# Patient Record
Sex: Male | Born: 1945 | Race: White | Hispanic: No | Marital: Married | Smoking: Former smoker
Health system: Southern US, Community
[De-identification: ages and names within clinical notes are randomized; demographics above are authoritative.]

## PROBLEM LIST (undated history)

## (undated) DIAGNOSIS — K219 Gastro-esophageal reflux disease without esophagitis: Secondary | ICD-10-CM

## (undated) DIAGNOSIS — G4730 Sleep apnea, unspecified: Secondary | ICD-10-CM

## (undated) DIAGNOSIS — H9190 Unspecified hearing loss, unspecified ear: Secondary | ICD-10-CM

## (undated) DIAGNOSIS — D649 Anemia, unspecified: Secondary | ICD-10-CM

## (undated) DIAGNOSIS — F419 Anxiety disorder, unspecified: Secondary | ICD-10-CM

## (undated) DIAGNOSIS — E7800 Pure hypercholesterolemia, unspecified: Secondary | ICD-10-CM

## (undated) DIAGNOSIS — E119 Type 2 diabetes mellitus without complications: Secondary | ICD-10-CM

## (undated) DIAGNOSIS — K7460 Unspecified cirrhosis of liver: Secondary | ICD-10-CM

## (undated) DIAGNOSIS — I10 Essential (primary) hypertension: Secondary | ICD-10-CM

## (undated) DIAGNOSIS — D696 Thrombocytopenia, unspecified: Secondary | ICD-10-CM

## (undated) DIAGNOSIS — M1990 Unspecified osteoarthritis, unspecified site: Secondary | ICD-10-CM

## (undated) HISTORY — PX: HERNIA REPAIR: SHX51

## (undated) HISTORY — DX: Unspecified cirrhosis of liver: K74.60

## (undated) HISTORY — PX: KNEE SURGERY: SHX244

## (undated) HISTORY — DX: Pure hypercholesterolemia, unspecified: E78.00

## (undated) HISTORY — DX: Type 2 diabetes mellitus without complications: E11.9

## (undated) HISTORY — DX: Thrombocytopenia, unspecified: D69.6

## (undated) HISTORY — DX: Anemia, unspecified: D64.9

---

## 2016-05-01 ENCOUNTER — Encounter (INDEPENDENT_AMBULATORY_CARE_PROVIDER_SITE_OTHER): Payer: Self-pay | Admitting: *Deleted

## 2016-05-23 ENCOUNTER — Ambulatory Visit (INDEPENDENT_AMBULATORY_CARE_PROVIDER_SITE_OTHER): Payer: Medicare Other | Admitting: Internal Medicine

## 2016-05-23 ENCOUNTER — Encounter (INDEPENDENT_AMBULATORY_CARE_PROVIDER_SITE_OTHER): Payer: Self-pay | Admitting: Internal Medicine

## 2016-05-23 ENCOUNTER — Encounter (INDEPENDENT_AMBULATORY_CARE_PROVIDER_SITE_OTHER): Payer: Self-pay

## 2016-05-23 VITALS — BP 112/68 | HR 62 | Temp 97.7°F | Resp 18 | Ht 69.0 in | Wt 238.8 lb

## 2016-05-23 DIAGNOSIS — E7800 Pure hypercholesterolemia, unspecified: Secondary | ICD-10-CM | POA: Insufficient documentation

## 2016-05-23 DIAGNOSIS — R197 Diarrhea, unspecified: Secondary | ICD-10-CM

## 2016-05-23 DIAGNOSIS — E119 Type 2 diabetes mellitus without complications: Secondary | ICD-10-CM | POA: Insufficient documentation

## 2016-05-23 DIAGNOSIS — D509 Iron deficiency anemia, unspecified: Secondary | ICD-10-CM | POA: Diagnosis not present

## 2016-05-23 DIAGNOSIS — Z794 Long term (current) use of insulin: Secondary | ICD-10-CM

## 2016-05-23 DIAGNOSIS — D539 Nutritional anemia, unspecified: Secondary | ICD-10-CM | POA: Insufficient documentation

## 2016-05-23 LAB — CBC WITH DIFFERENTIAL/PLATELET
BASOS ABS: 42 {cells}/uL (ref 0–200)
Basophils Relative: 1 %
Eosinophils Absolute: 84 cells/uL (ref 15–500)
Eosinophils Relative: 2 %
HEMATOCRIT: 38.9 % (ref 38.5–50.0)
HEMOGLOBIN: 12.4 g/dL — AB (ref 13.2–17.1)
LYMPHS ABS: 1008 {cells}/uL (ref 850–3900)
Lymphocytes Relative: 24 %
MCH: 29.2 pg (ref 27.0–33.0)
MCHC: 31.9 g/dL — AB (ref 32.0–36.0)
MCV: 91.7 fL (ref 80.0–100.0)
MONO ABS: 294 {cells}/uL (ref 200–950)
MPV: 11.3 fL (ref 7.5–12.5)
Monocytes Relative: 7 %
NEUTROS PCT: 66 %
Neutro Abs: 2772 cells/uL (ref 1500–7800)
Platelets: 110 10*3/uL — ABNORMAL LOW (ref 140–400)
RBC: 4.24 MIL/uL (ref 4.20–5.80)
RDW: 14.7 % (ref 11.0–15.0)
WBC: 4.2 10*3/uL (ref 3.8–10.8)

## 2016-05-23 LAB — FERRITIN: Ferritin: 26 ng/mL (ref 20–380)

## 2016-05-23 MED ORDER — DICYCLOMINE HCL 10 MG PO CAPS: 10.0000 mg | ORAL_CAPSULE | Freq: Three times a day (TID) | ORAL | Status: DC

## 2016-05-23 NOTE — Patient Instructions (Signed)
Labs today. Dicyclomine 67m tid

## 2016-05-23 NOTE — Progress Notes (Addendum)
   Subjective:    Patient ID: Larry Hayes, male    DOB: [birth year], 70 y.o.   MRN: [id number]  HPI Referrred by Dr. Ginette Otto for anemia/ diarrhea. Hx of anemia. He also has diarrhea. He has had diarrhea for 2-3 years. He says the diarrhea has progressively worsened and sometimes he is incontinent. Sometimes he will have 7-8 stools a day. His stools are never formed.  He has tried Imodium which did not help.  He has been evaluated by Dr. Davina Poke for anemia. Stool was negative for blood. Colonoscopy and EGD were normal.  Appetite is good. No weight. Usually average about 5 stools a day. No melena or BRRB.    Hx of cirrhosis for NAFLD.    04/17/2016 H and H 12.3 and 389.2, platelet ct 100. Iron 45, Transferrin 320, TIBC 448, UIBC 403, Ferritin 9.20, % saturation 10.   His last colonoscopy was in 2016 per records by Dr. Farris Has in [place]. (anemia)  Normal per patient. EGD showed gastritis.   Review of Systems Past Medical History  Diagnosis Date  . Diabetes ([place])   . High cholesterol     Past Surgical History  Procedure Laterality Date  . Knee surgery      rt arthroscopy  . Hernia repair      bilateral inguinal age 3    No Known Allergies  No current outpatient prescriptions on file prior to visit.   No current facility-administered medications on file prior to visit.   Current Outpatient Prescriptions  Medication Sig Dispense Refill  . atorvastatin (LIPITOR) 80 MG tablet Take 80 mg by mouth at bedtime.    . ferrous sulfate 325 (65 FE) MG tablet Take 325 mg by mouth 2 (two) times daily with a meal.    . insulin glargine (LANTUS) 100 UNIT/ML injection Inject 45 Units into the skin at bedtime.    . ISOSORBIDE PO Take 30 mg by mouth daily.    . Magnesium Oxide 400 (240 Mg) MG TABS Take 400 mg by mouth daily.    . metFORMIN (GLUCOPHAGE) 1000 MG tablet Take 1,000 mg by mouth 2 (two) times daily with a meal.    . METOPROLOL TARTRATE PO Take 25 mg by mouth 2 (two) times daily.     Marland Kitchen omeprazole (PRILOSEC) 40 MG capsule Take 40 mg by mouth every evening.    Marland Kitchen OVER THE COUNTER MEDICATION Alphabetic MVI - Patient states that he takes 1 by mouth every morning.    Marland Kitchen PARoxetine (PAXIL) 20 MG tablet Take 20 mg by mouth daily.     No current facility-administered medications for this visit.        Objective:   Physical Exam  Blood pressure 112/68, pulse 62, temperature 97.7 F (36.5 C), temperature source Oral, resp. rate 18, height 5' 9"  (1.753 m), weight 238 lb 12.8 oz (108.319 kg). Alert and oriented. Skin warm and dry. Oral mucosa is moist.   . Sclera anicteric, conjunctivae is pink. Thyroid not enlarged. No cervical lymphadenopathy. Lungs clear. Heart regular rate and rhythm.  Abdomen is soft. Bowel sounds are positive. No hepatomegaly. No abdominal masses felt. No tenderness.  No edema to lower extremities.   Stool brown and guaiac positive.  Lot [id number] Ex 9/17    Assessment & Plan:  Anemia. Guaiac positive stool. Recent EGD and Colonoscopy normal. Will get reports.  May need Given's capsule.  Iron studies today. Further recommendations to follow.

## 2016-05-24 LAB — IRON: IRON: 240 ug/dL — AB (ref 50–180)

## 2016-05-29 ENCOUNTER — Telehealth (INDEPENDENT_AMBULATORY_CARE_PROVIDER_SITE_OTHER): Payer: Self-pay | Admitting: Internal Medicine

## 2016-05-29 DIAGNOSIS — D509 Iron deficiency anemia, unspecified: Secondary | ICD-10-CM

## 2016-05-29 NOTE — Telephone Encounter (Signed)
Patient called to get his results and he wants to know if he needs a return appointment. I did call the patient to let him know Karna Christmas was not here this afternoon, but she'll be here tomorrow.  [phone]

## 2016-05-30 NOTE — Telephone Encounter (Signed)
Lab orde is noted for 4 weeks.

## 2016-05-31 ENCOUNTER — Encounter (INDEPENDENT_AMBULATORY_CARE_PROVIDER_SITE_OTHER): Payer: Self-pay | Admitting: *Deleted

## 2016-05-31 ENCOUNTER — Other Ambulatory Visit (INDEPENDENT_AMBULATORY_CARE_PROVIDER_SITE_OTHER): Payer: Self-pay | Admitting: *Deleted

## 2016-05-31 DIAGNOSIS — D509 Iron deficiency anemia, unspecified: Secondary | ICD-10-CM

## 2016-06-01 ENCOUNTER — Encounter (INDEPENDENT_AMBULATORY_CARE_PROVIDER_SITE_OTHER): Payer: Self-pay | Admitting: Internal Medicine

## 2016-06-01 NOTE — Progress Notes (Signed)
Patient was given an appointment for 07/04/16 at 2:45pm.  An appointment letter was mailed to the patient.

## 2016-06-04 ENCOUNTER — Encounter (INDEPENDENT_AMBULATORY_CARE_PROVIDER_SITE_OTHER): Payer: Self-pay

## 2016-06-21 ENCOUNTER — Telehealth (INDEPENDENT_AMBULATORY_CARE_PROVIDER_SITE_OTHER): Payer: Self-pay | Admitting: Internal Medicine

## 2016-06-21 NOTE — Telephone Encounter (Signed)
At [place] in July. hemocult and CBC. May need an EGD per Dr. Laural Golden. He had EGD/Colonoscopy in 2016

## 2016-06-27 LAB — CBC
HCT: 37.9 % — ABNORMAL LOW (ref 38.5–50.0)
Hemoglobin: 12.2 g/dL — ABNORMAL LOW (ref 13.2–17.1)
MCH: 30.3 pg (ref 27.0–33.0)
MCHC: 32.2 g/dL (ref 32.0–36.0)
MCV: 94 fL (ref 80.0–100.0)
MPV: 11.7 fL (ref 7.5–12.5)
PLATELETS: 103 10*3/uL — AB (ref 140–400)
RBC: 4.03 MIL/uL — AB (ref 4.20–5.80)
RDW: 14.6 % (ref 11.0–15.0)
WBC: 4.7 10*3/uL (ref 3.8–10.8)

## 2016-07-02 ENCOUNTER — Telehealth (INDEPENDENT_AMBULATORY_CARE_PROVIDER_SITE_OTHER): Payer: Self-pay | Admitting: Internal Medicine

## 2016-07-03 NOTE — Telephone Encounter (Signed)
error 

## 2016-07-04 ENCOUNTER — Ambulatory Visit (INDEPENDENT_AMBULATORY_CARE_PROVIDER_SITE_OTHER): Payer: Medicare Other | Admitting: Internal Medicine

## 2016-07-04 ENCOUNTER — Encounter (INDEPENDENT_AMBULATORY_CARE_PROVIDER_SITE_OTHER): Payer: Self-pay | Admitting: Internal Medicine

## 2016-07-04 VITALS — BP 130/50 | HR 80 | Ht 69.0 in | Wt 238.3 lb

## 2016-07-04 DIAGNOSIS — R197 Diarrhea, unspecified: Secondary | ICD-10-CM

## 2016-07-04 DIAGNOSIS — R152 Fecal urgency: Secondary | ICD-10-CM | POA: Diagnosis not present

## 2016-07-04 DIAGNOSIS — R195 Other fecal abnormalities: Secondary | ICD-10-CM

## 2016-07-04 NOTE — Patient Instructions (Addendum)
CBC today. OV in 3 months.  Increase Dicyclomine to 4 times a day.

## 2016-07-04 NOTE — Progress Notes (Signed)
Subjective:    Patient ID: Larry Hayes, male    DOB: [birth year], 70 y.o.   MRN: [id number]  HPIHere today for f/u. He was last seen last month for anemia/diarrhea. Hx of anemia. Has had diarrhea  for 2-3 years.  Diarrhea has progressively worsened.  Sometimes he will have 7-8 stools a day.  Stools were never formed. Imodium has not helped in the past.  Evaluated by Dr. Davina Poke for anemia. Stool negative for blood.   In 2016 colonoscopy and EGD in 2016 were normal (anemia). He was guaiac postive in office with me.  AT [facility] , hemoccult and CBC. May need an EGD. He tells me is doing okay. He says he doesn't have a lot of strength.  He continues to have about 4-5 stools a day. In the morning, his stool is formed. His stool is liquid after breakfast. If he has the urge to go to have a BM, he needs to go right then His appetite is good. No weight loss.     CBC Latest Ref Rng & Units 06/27/2016 05/23/2016  WBC 3.8 - 10.8 K/uL 4.7 4.2  Hemoglobin 13.2 - 17.1 g/dL 12.2(L) 12.4(L)  Hematocrit 38.5 - 50.0 % 37.9(L) 38.9  Platelets 140 - 400 K/uL 103(L) 110(L)       05/23/2016 Ferritin 26 Iron 240.   04/17/2016 H and H 12.3 and 389.2, platelet ct 100. Iron 45, Transferrin 320, TIBC 448, UIBC 403, Ferritin 9.20, % saturation 10.   His last colonoscopy was in 2016 per records by Dr. Farris Has in [place]. (anemia)  Normal per patient. EGD showed gastritis.      Review of Systems Past Medical History:  Diagnosis Date  . Anemia   . Diabetes ([place])   . High cholesterol     Past Surgical History:  Procedure Laterality Date  . HERNIA REPAIR     bilateral inguinal age 57  . KNEE SURGERY     rt arthroscopy    No Known Allergies  Current Outpatient Prescriptions on File Prior to Visit  Medication Sig Dispense Refill  . atorvastatin (LIPITOR) 80 MG tablet Take 80 mg by mouth at bedtime.    . dicyclomine (BENTYL) 10 MG capsule Take 1 capsule (10 mg total) by mouth 3 (three) times  daily before meals. 90 capsule 3  . ferrous sulfate 325 (65 FE) MG tablet Take 325 mg by mouth 2 (two) times daily with a meal.    . insulin glargine (LANTUS) 100 UNIT/ML injection Inject 45 Units into the skin at bedtime.    . ISOSORBIDE PO Take 30 mg by mouth daily.    . Magnesium Oxide 400 (240 Mg) MG TABS Take 400 mg by mouth daily.    . metFORMIN (GLUCOPHAGE) 1000 MG tablet Take 1,000 mg by mouth 2 (two) times daily with a meal.    . METOPROLOL TARTRATE PO Take 25 mg by mouth 2 (two) times daily.    Marland Kitchen omeprazole (PRILOSEC) 40 MG capsule Take 40 mg by mouth every evening.    Marland Kitchen OVER THE COUNTER MEDICATION Alphabetic MVI - Patient states that he takes 1 by mouth every morning.    Marland Kitchen PARoxetine (PAXIL) 20 MG tablet Take 20 mg by mouth daily.     No current facility-administered medications on file prior to visit.        Objective:   Physical Exam Blood pressure (!) 130/50, pulse 80, height 5' 9"  (1.753 m), weight 238 lb 4.8 oz (108.1 kg). Alert and  oriented. Skin warm and dry. Oral mucosa is moist.   . Sclera anicteric, conjunctivae is pink. Thyroid not enlarged. No cervical lymphadenopathy. Lungs clear. Heart regular rate and rhythm.  Abdomen is soft. Bowel sounds are positive. No hepatomegaly. No abdominal masses felt. No tenderness.  No edema to lower extremities.    Stool brown and guaiac negative.       Assessment & Plan:  Diarrhea/Urgency. CBC today.  OV in 3 months.  Three stool cards sent home with patient.  Will again discuss with Dr. Laural Golden.

## 2016-07-05 LAB — CBC
HEMATOCRIT: 36.9 % — AB (ref 38.5–50.0)
HEMOGLOBIN: 11.8 g/dL — AB (ref 13.2–17.1)
MCH: 30.3 pg (ref 27.0–33.0)
MCHC: 32 g/dL (ref 32.0–36.0)
MCV: 94.6 fL (ref 80.0–100.0)
MPV: 11.7 fL (ref 7.5–12.5)
Platelets: 99 10*3/uL — ABNORMAL LOW (ref 140–400)
RBC: 3.9 MIL/uL — AB (ref 4.20–5.80)
RDW: 14.4 % (ref 11.0–15.0)
WBC: 4.5 10*3/uL (ref 3.8–10.8)

## 2016-07-30 ENCOUNTER — Telehealth (INDEPENDENT_AMBULATORY_CARE_PROVIDER_SITE_OTHER): Payer: Self-pay | Admitting: Internal Medicine

## 2016-07-30 ENCOUNTER — Encounter (INDEPENDENT_AMBULATORY_CARE_PROVIDER_SITE_OTHER): Payer: Self-pay | Admitting: Internal Medicine

## 2016-07-30 ENCOUNTER — Telehealth (INDEPENDENT_AMBULATORY_CARE_PROVIDER_SITE_OTHER): Payer: Self-pay | Admitting: *Deleted

## 2016-07-30 ENCOUNTER — Other Ambulatory Visit (INDEPENDENT_AMBULATORY_CARE_PROVIDER_SITE_OTHER): Payer: Self-pay | Admitting: Internal Medicine

## 2016-07-30 DIAGNOSIS — R195 Other fecal abnormalities: Secondary | ICD-10-CM

## 2016-07-30 DIAGNOSIS — D696 Thrombocytopenia, unspecified: Secondary | ICD-10-CM

## 2016-07-30 DIAGNOSIS — D500 Iron deficiency anemia secondary to blood loss (chronic): Secondary | ICD-10-CM

## 2016-07-30 NOTE — Telephone Encounter (Signed)
   Diagnosis:    Result(s)   Card 1: Positive    Card 2: Positive:   Card 3:  Negative:    Completed by: Delrae Renderri Setzer,NP   HEMOCCULT SENSA DEVELOPER: LOT#:[id number]   EXPIRATION DATE: 9/18   HEMOCCULT SENSA CARD:  LOT#:[id number]   EXPIRATION DATE: 07-18   CARD CONTROL RESULTS:  POSITIVE:Positive NEGATIVE: Negative    ADDITIONAL COMMENTS: Camelia Engerri has talked with Dr.Rehman and the patient is going to have a EGD. Camelia Engerri has made the patient aware. Dewayne Hatchnn will contact the patient.

## 2016-07-30 NOTE — Telephone Encounter (Signed)
Larry Hayes, US abdomen. I have talked with patient.

## 2016-07-30 NOTE — Telephone Encounter (Signed)
Patient is aware 

## 2016-07-30 NOTE — Telephone Encounter (Signed)
US abdomen ordered

## 2016-07-30 NOTE — Telephone Encounter (Signed)
Ann, EGD. I have spoken with patient.

## 2016-07-31 DIAGNOSIS — R195 Other fecal abnormalities: Secondary | ICD-10-CM | POA: Insufficient documentation

## 2016-07-31 DIAGNOSIS — D500 Iron deficiency anemia secondary to blood loss (chronic): Secondary | ICD-10-CM | POA: Insufficient documentation

## 2016-07-31 NOTE — Telephone Encounter (Signed)
US sch'd 08/02/16, patient aware

## 2016-07-31 NOTE — Telephone Encounter (Signed)
EGD sch'd 08/01/16, patient aware 

## 2016-07-31 NOTE — Telephone Encounter (Signed)
EGD sch'd 08/01/16, patient aware

## 2016-08-01 ENCOUNTER — Ambulatory Visit (HOSPITAL_COMMUNITY)
Admission: RE | Admit: 2016-08-01 | Discharge: 2016-08-01 | Disposition: A | Discharge status: Home / Self Care | Payer: Medicare Other | Source: Ambulatory Visit | Attending: Internal Medicine | Admitting: Internal Medicine

## 2016-08-01 ENCOUNTER — Encounter (HOSPITAL_COMMUNITY): Payer: Self-pay | Admitting: *Deleted

## 2016-08-01 ENCOUNTER — Encounter (HOSPITAL_COMMUNITY)
Admission: RE | Disposition: A | Discharge status: Home / Self Care | Payer: Self-pay | Source: Ambulatory Visit | Attending: Internal Medicine

## 2016-08-01 DIAGNOSIS — E119 Type 2 diabetes mellitus without complications: Secondary | ICD-10-CM | POA: Diagnosis not present

## 2016-08-01 DIAGNOSIS — K921 Melena: Secondary | ICD-10-CM | POA: Insufficient documentation

## 2016-08-01 DIAGNOSIS — K3189 Other diseases of stomach and duodenum: Secondary | ICD-10-CM | POA: Diagnosis not present

## 2016-08-01 DIAGNOSIS — Z8719 Personal history of other diseases of the digestive system: Secondary | ICD-10-CM | POA: Diagnosis not present

## 2016-08-01 DIAGNOSIS — Z794 Long term (current) use of insulin: Secondary | ICD-10-CM | POA: Diagnosis not present

## 2016-08-01 DIAGNOSIS — D509 Iron deficiency anemia, unspecified: Secondary | ICD-10-CM | POA: Diagnosis present

## 2016-08-01 DIAGNOSIS — Z87891 Personal history of nicotine dependence: Secondary | ICD-10-CM | POA: Diagnosis not present

## 2016-08-01 DIAGNOSIS — K766 Portal hypertension: Secondary | ICD-10-CM | POA: Diagnosis not present

## 2016-08-01 DIAGNOSIS — K2970 Gastritis, unspecified, without bleeding: Secondary | ICD-10-CM | POA: Diagnosis not present

## 2016-08-01 DIAGNOSIS — R195 Other fecal abnormalities: Secondary | ICD-10-CM

## 2016-08-01 DIAGNOSIS — Z79899 Other long term (current) drug therapy: Secondary | ICD-10-CM | POA: Insufficient documentation

## 2016-08-01 DIAGNOSIS — D500 Iron deficiency anemia secondary to blood loss (chronic): Secondary | ICD-10-CM | POA: Diagnosis not present

## 2016-08-01 HISTORY — PX: ESOPHAGOGASTRODUODENOSCOPY: SHX5428

## 2016-08-01 LAB — GLUCOSE, CAPILLARY: GLUCOSE-CAPILLARY: 102 mg/dL — AB (ref 65–99)

## 2016-08-01 SURGERY — EGD (ESOPHAGOGASTRODUODENOSCOPY)
Anesthesia: Moderate Sedation

## 2016-08-01 MED ORDER — STERILE WATER FOR IRRIGATION IR SOLN
Status: DC | PRN
  Administered 2016-08-01: 100 mL

## 2016-08-01 MED ORDER — BUTAMBEN-TETRACAINE-BENZOCAINE 2-2-14 % EX AERO
INHALATION_SPRAY | CUTANEOUS | Status: DC | PRN
  Administered 2016-08-01: 2 via TOPICAL

## 2016-08-01 MED ORDER — MIDAZOLAM HCL 5 MG/5ML IJ SOLN
INTRAMUSCULAR | Status: DC | PRN
  Administered 2016-08-01: 2 mg via INTRAVENOUS
  Administered 2016-08-01 (×2): 1 mg via INTRAVENOUS
  Administered 2016-08-01: 2 mg via INTRAVENOUS

## 2016-08-01 MED ORDER — MEPERIDINE HCL 50 MG/ML IJ SOLN
INTRAMUSCULAR | Status: AC
  Filled 2016-08-01: qty 1

## 2016-08-01 MED ORDER — MEPERIDINE HCL 50 MG/ML IJ SOLN
INTRAMUSCULAR | Status: DC | PRN
  Administered 2016-08-01 (×2): 25 mg via INTRAVENOUS

## 2016-08-01 MED ORDER — MIDAZOLAM HCL 5 MG/5ML IJ SOLN
INTRAMUSCULAR | Status: AC
  Filled 2016-08-01: qty 10

## 2016-08-01 MED ORDER — SODIUM CHLORIDE 0.9 % IV SOLN
INTRAVENOUS | Status: DC
  Administered 2016-08-01: 14:00:00 via INTRAVENOUS

## 2016-08-01 NOTE — Discharge Instructions (Addendum)
Gastrointestinal Endoscopy, Care After Refer to this sheet in the next few weeks. These instructions provide you with information on caring for yourself after your procedure. Your caregiver may also give you more specific instructions. Your treatment has been planned according to current medical practices, but problems sometimes occur. Call your caregiver if you have any problems or questions after your procedure. HOME CARE INSTRUCTIONS  If you were given medicine to help you relax (sedative), do not drive, operate machinery, or sign important documents for 24 hours.  Avoid alcohol and hot or warm beverages for the first 24 hours after the procedure.  Only take over-the-counter or prescription medicines for pain, discomfort, or fever as directed by your caregiver. You may resume taking your normal medicines unless your caregiver tells you otherwise. Ask your caregiver when you may resume taking medicines that may cause bleeding, such as aspirin, clopidogrel, or warfarin.  You may return to your normal diet and activities on the day after your procedure, or as directed by your caregiver. Walking may help to reduce any bloated feeling in your abdomen.  Drink enough fluids to keep your urine clear or pale yellow.  You may gargle with salt water if you have a sore throat. SEEK IMMEDIATE MEDICAL CARE IF:  You have severe nausea or vomiting.  You have severe abdominal pain, abdominal cramps that last longer than 6 hours, or abdominal swelling (distention).  You have severe shoulder or back pain.  You have trouble swallowing.  You have shortness of breath, your breathing is shallow, or you are breathing faster than normal.  You have a fever or a rapid heartbeat.  You vomit blood or material that looks like coffee grounds.  You have bloody, black, or tarry stools. MAKE SURE YOU:  Understand these instructions.  Will watch your condition.  Will get help right away if you are not doing  well or get worse.   This information is not intended to replace advice given to you by your health care provider. Make sure you discuss any questions you have with your health care provider.   Resume usual medications and diet. No driving for 24 hours. Physician will call with results of biopsy and ultrasound(to be done tomorrow). Will check hemoglobin in one month. Office will call.

## 2016-08-01 NOTE — Op Note (Signed)
[facility] Patient Name: Larry Hayes Procedure Date: 08/01/2016 2:24 PM MRN: [id number] Date of Birth: 1946-08-10 Attending MD: Hildred Laser , MD CSN: [id number] Age: 70 Admit Type: Outpatient Procedure:                Upper GI endoscopy Indications:              Iron deficiency anemia secondary to chronic blood                            loss, Melena. Patient gives history of duodenal                            ulcer about 10 years ago. Providers:                Hildred Laser, MD, Rosina Lowenstein, RN, Isabella Stalling, Technician Referring MD:             Olena Mater, MD Medicines:                Cetacaine spray, Meperidine 50 mg IV, Midazolam 6                            mg IV Complications:            No immediate complications. Estimated Blood Loss:     Estimated blood loss was minimal. Procedure:                Pre-Anesthesia Assessment:                           - Prior to the procedure, a History and Physical                            was performed, and patient medications and                            allergies were reviewed. The patient's tolerance of                            previous anesthesia was also reviewed. The risks                            and benefits of the procedure and the sedation                            options and risks were discussed with the patient.                            All questions were answered, and informed consent                            was obtained. Prior Anticoagulants: The patient has  taken no previous anticoagulant or antiplatelet                            agents. ASA Grade Assessment: III - A patient with                            severe systemic disease. After reviewing the risks                            and benefits, the patient was deemed in                            satisfactory condition to undergo the procedure.                           After obtaining  informed consent, the endoscope was                            passed under direct vision. Throughout the                            procedure, the patient's blood pressure, pulse, and                            oxygen saturations were monitored continuously. The                            [id number] ([id number]) scope was introduced through the                            mouth, and advanced to the second part of duodenum.                            The upper GI endoscopy was accomplished without                            difficulty. The patient tolerated the procedure                            well. Scope In: 2:51:59 PM Scope Out: 2:59:50 PM Total Procedure Duration: 0 hours 7 minutes 51 seconds  Findings:      The examined esophagus was normal.      The Z-line was regular and was found 41 cm from the incisors.      Moderate portal hypertensive gastropathy was found in the gastric fundus       and in the gastric body with stigmata of bleed.      Nodular mucosa was found in the gastric antrum and in the prepyloric       region of the stomach. Biopsies were taken with a cold forceps for       histology.      The duodenal bulb and second portion of the duodenum were normal. Impression:               - Normal esophagus.                           -  Z-line regular, 41 cm from the incisors.                           - Portal hypertensive gastropathy with stigmata of                            bleed                           - Nodular mucosa in the gastric antrum and in the                            prepyloric region of the stomach. Biopsied.                           - Normal duodenal bulb and second portion of the                            duodenum.                           Comment; Suspect patient has been losing blood                            secondary to portal gastropathy.                           He does not have esophageal or gastric varices.                           Patient will  return for upper abdominal ultrasound                            on 08/02/2016. Moderate Sedation:      Moderate (conscious) sedation was administered by the endoscopy nurse       and supervised by the endoscopist. The following parameters were       monitored: oxygen saturation, heart rate, blood pressure, CO2       capnography and response to care. Total physician intraservice time was       13 minutes. Recommendation:           - Patient has a contact number available for                            emergencies. The signs and symptoms of potential                            delayed complications were discussed with the                            patient. Return to normal activities tomorrow.                            Written discharge instructions were provided to the  patient.                           - Resume previous diet today.                           - Continue present medications.                           - Await pathology results. Procedure Code(s):        --- Professional ---                           [id number], Esophagogastroduodenoscopy, flexible,                            transoral; with biopsy, single or multiple                           99152, Moderate sedation services provided by the                            same physician or other qualified health care                            professional performing the diagnostic or                            therapeutic service that the sedation supports,                            requiring the presence of an independent trained                            observer to assist in the monitoring of the                            patient's level of consciousness and physiological                            status; initial 15 minutes of intraservice time,                            patient age 70 years or older Diagnosis Code(s):        --- Professional ---                           K76.6, Portal  hypertension                           K31.89, Other diseases of stomach and duodenum                           D50.0, Iron deficiency anemia secondary to blood  loss (chronic)                           K92.1, Melena (includes Hematochezia) CPT copyright 2016 American Medical Association. All rights reserved. The codes documented in this report are preliminary and upon coder review may  be revised to meet current compliance requirements. Hildred Laser, MD Hildred Laser, MD 08/01/2016 3:11:05 PM This report has been signed electronically. Number of Addenda: 0

## 2016-08-01 NOTE — H&P (Signed)
Larry Hayes is an 70 y.o. male.   Chief Complaint: Patient is here for EGD. HPI: Patient is 70 year old Caucasian male who is undergoing diagnostic EGD. He has history of iron deficiency anemia and heme positive stool. He was found to be anemic back in May 2017 when serum iron saturation and ferritin were all low. He underwent EGD in June 2016 revealing mild gastritis. Colonoscopy in September 2016 and was normal exam. Both of the studies were performed by Dr. Audie [facility] of [facility]. He has noted his stools. Black intermittently but he is not sure whether started before he went on iron. He has history of duodenal ulcer. He does not take OTC NSAIDs. He denies heartburn nausea vomiting abdominal pain or rectal bleeding.  Past Medical History:  Diagnosis Date  . Anemia   . Diabetes ([place])   . High cholesterol       Duodenal diagnosed over 10 years ago when he presented with pain.  Past Surgical History:  Procedure Laterality Date  . HERNIA REPAIR     bilateral inguinal age 2  . KNEE SURGERY     rt arthroscopy    History reviewed. No pertinent family history. Social History:  reports that he quit smoking about 15 years ago. He has never used smokeless tobacco. He reports that he does not drink alcohol or use drugs.  Allergies: No Known Allergies  Medications Prior to Admission  Medication Sig Dispense Refill  . atorvastatin (LIPITOR) 80 MG tablet Take 80 mg by mouth at bedtime.    . dicyclomine (BENTYL) 10 MG capsule Take 1 capsule (10 mg total) by mouth 3 (three) times daily before meals. 90 capsule 3  . ferrous sulfate 325 (65 FE) MG tablet Take 325 mg by mouth 2 (two) times daily with a meal.    . insulin glargine (LANTUS) 100 UNIT/ML injection Inject 45 Units into the skin at bedtime.    . ISOSORBIDE PO Take 30 mg by mouth daily.    . Magnesium Oxide 400 (240 Mg) MG TABS Take 400 mg by mouth daily.    . metFORMIN (GLUCOPHAGE) 1000 MG tablet Take 1,000 mg by mouth 2 (two)  times daily with a meal.    . METOPROLOL TARTRATE PO Take 25 mg by mouth 2 (two) times daily.    Marland Kitchen omeprazole (PRILOSEC) 40 MG capsule Take 40 mg by mouth every evening.    Marland Kitchen OVER THE COUNTER MEDICATION Alphabetic MVI - Patient states that he takes 1 by mouth every morning.    Marland Kitchen PARoxetine (PAXIL) 20 MG tablet Take 20 mg by mouth daily.      Results for orders placed or performed during the hospital encounter of 08/01/16 (from the past 48 hour(s))  Glucose, capillary     Status: Abnormal   Collection Time: 08/01/16  1:49 PM  Result Value Ref Range   Glucose-Capillary 102 (H) 65 - 99 mg/dL   No results found.  ROS  Blood pressure 129/65, pulse 62, temperature 97.8 F (36.6 C), temperature source Oral, resp. rate 17, height 5' 9"  (1.753 m), weight 236 lb (107 kg), SpO2 95 %. Physical Exam  Constitutional: He appears well-developed and well-nourished.  HENT:  Mouth/Throat: Oropharynx is clear and moist.  Eyes: Conjunctivae are normal. No scleral icterus.  Neck: No thyromegaly present.  Cardiovascular: Normal rate, regular rhythm and normal heart sounds.   No murmur heard. Respiratory: Effort normal and breath sounds normal.  GI:  Abdomen is protuberant but soft and nontender without organomegaly  or masses.  Musculoskeletal: He exhibits no edema.  Lymphadenopathy:    He has no cervical adenopathy.  Neurological: He is alert.  Skin: Skin is warm and dry.     Assessment/Plan Iron deficiency anemia and heme positive stool. History of peptic ulcer disease. Diagnostic EGD.  Hildred Laser, MD 08/01/2016, 2:40 PM

## 2016-08-02 ENCOUNTER — Ambulatory Visit (HOSPITAL_COMMUNITY)
Admission: RE | Admit: 2016-08-02 | Discharge: 2016-08-02 | Disposition: A | Discharge status: Home / Self Care | Payer: Medicare Other | Source: Ambulatory Visit | Attending: Internal Medicine | Admitting: Internal Medicine

## 2016-08-02 DIAGNOSIS — D696 Thrombocytopenia, unspecified: Secondary | ICD-10-CM | POA: Diagnosis not present

## 2016-08-02 DIAGNOSIS — K7460 Unspecified cirrhosis of liver: Secondary | ICD-10-CM | POA: Insufficient documentation

## 2016-08-02 DIAGNOSIS — R188 Other ascites: Secondary | ICD-10-CM | POA: Insufficient documentation

## 2016-08-02 DIAGNOSIS — R161 Splenomegaly, not elsewhere classified: Secondary | ICD-10-CM | POA: Insufficient documentation

## 2016-08-06 ENCOUNTER — Telehealth (INDEPENDENT_AMBULATORY_CARE_PROVIDER_SITE_OTHER): Payer: Self-pay | Admitting: Internal Medicine

## 2016-08-06 ENCOUNTER — Encounter (HOSPITAL_COMMUNITY): Payer: Self-pay | Admitting: Internal Medicine

## 2016-08-06 NOTE — Telephone Encounter (Signed)
Patient called, would like test results.  [phone]

## 2016-08-07 NOTE — Telephone Encounter (Signed)
Diagnosis Stomach, biopsy, nodular mucosa at antrum and pre pyloric FOVEOLAR HYPERPLASIA REACTIVE GASTROPATHY/CHEMICAL GASTRITIS NO HELICOBACTER PYLORI ORGANISMS ARE IDENTIFIED (WARTHIN-STARRY STAIN) NEGATIVE FOR DYSPLASIA OR MALIGNANCY Marlena ClipperZhaoli Lane MD Pathologist, Electronic Signature (Case signed 08/03/2016) Specimen Gross and Clinical Information Specimen(s) Obtained: Stomach, biopsy, nodular mucosa at antrum and pre pyloric Specimen Clinical  To be discussed with Dr.Rehman.

## 2016-08-07 NOTE — Telephone Encounter (Signed)
Results have been given

## 2016-08-07 NOTE — Telephone Encounter (Signed)
Patient called and stated that he'd like his results from last week.  He stated that he spoke to Terri this morning, but she stated that she said she didn't know anything.  He really would like to Dr. Laural Golden.  [phone]

## 2016-08-08 ENCOUNTER — Other Ambulatory Visit (INDEPENDENT_AMBULATORY_CARE_PROVIDER_SITE_OTHER): Payer: Self-pay | Admitting: *Deleted

## 2016-08-08 ENCOUNTER — Encounter (INDEPENDENT_AMBULATORY_CARE_PROVIDER_SITE_OTHER): Payer: Self-pay | Admitting: *Deleted

## 2016-08-08 DIAGNOSIS — D508 Other iron deficiency anemias: Secondary | ICD-10-CM

## 2016-08-08 DIAGNOSIS — D509 Iron deficiency anemia, unspecified: Secondary | ICD-10-CM

## 2016-08-08 DIAGNOSIS — R195 Other fecal abnormalities: Secondary | ICD-10-CM

## 2016-08-08 NOTE — Telephone Encounter (Signed)
Dr.Rehman was made aware of this and he stated that he was going to call the patient.

## 2016-08-08 NOTE — Progress Notes (Signed)
Patient already has an appointment scheduled for 10/04/16 at 1:45pm

## 2016-08-20 ENCOUNTER — Other Ambulatory Visit (INDEPENDENT_AMBULATORY_CARE_PROVIDER_SITE_OTHER): Payer: Self-pay | Admitting: *Deleted

## 2016-08-20 DIAGNOSIS — D500 Iron deficiency anemia secondary to blood loss (chronic): Secondary | ICD-10-CM

## 2016-08-21 ENCOUNTER — Ambulatory Visit (INDEPENDENT_AMBULATORY_CARE_PROVIDER_SITE_OTHER): Payer: Medicare Other | Admitting: Internal Medicine

## 2016-08-21 ENCOUNTER — Encounter (INDEPENDENT_AMBULATORY_CARE_PROVIDER_SITE_OTHER): Payer: Self-pay | Admitting: Internal Medicine

## 2016-08-21 ENCOUNTER — Other Ambulatory Visit (INDEPENDENT_AMBULATORY_CARE_PROVIDER_SITE_OTHER): Payer: Self-pay | Admitting: *Deleted

## 2016-08-21 VITALS — BP 120/60 | HR 64 | Temp 98.2°F | Ht 69.0 in | Wt 242.1 lb

## 2016-08-21 DIAGNOSIS — D696 Thrombocytopenia, unspecified: Secondary | ICD-10-CM | POA: Diagnosis not present

## 2016-08-21 DIAGNOSIS — R197 Diarrhea, unspecified: Secondary | ICD-10-CM | POA: Diagnosis not present

## 2016-08-21 DIAGNOSIS — K7469 Other cirrhosis of liver: Secondary | ICD-10-CM | POA: Diagnosis not present

## 2016-08-21 DIAGNOSIS — K7460 Unspecified cirrhosis of liver: Secondary | ICD-10-CM

## 2016-08-21 HISTORY — DX: Thrombocytopenia, unspecified: D69.6

## 2016-08-21 HISTORY — DX: Unspecified cirrhosis of liver: K74.60

## 2016-08-21 NOTE — Patient Instructions (Signed)
Fiber 4 gms po Labs today.

## 2016-08-21 NOTE — Progress Notes (Signed)
Subjective:    Patient ID: Larry Hayes, male    DOB: [birth year], 70 y.o.   MRN: [id number]  HPI  Here today for f/u. Underwent an EGD in August for IDA and guaiac positive stools He tells me he has diarrhea. He has diarrhea daily. He is taking Bentyl QID.  He says when the pain hits, he has to go to the BR.  His appetite is good. No weight loss. He says for the most part he feels good. Sometimes he feels weak.        Hx of cirrhosis for NAFLD (Patient states he was unaware of this).   04/17/2016 H and H 12.3 and 389.2, platelet ct 100. Iron 45, Transferrin 320, TIBC 448, UIBC 403, Ferritin 9.20, % saturation 10.     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  Impression:               - Normal esophagus.                           - Z-line regular, 41 cm from the incisors.                           - Portal hypertensive gastropathy with stigmata of                            bleed                           - Nodular mucosa in the gastric antrum and in the                            prepyloric region of the stomach. Biopsied.                           - Normal duodenal bulb and second portion of the                            duodenum.                            Comment; Suspect patient has been losing blood                            secondary to portal gastropathy.                           He does not have esophageal or gastric varices.                           Patient will return for upper abdominal ultrasound                            on 08/02/2016. 08/02/2016 US abdomen: increased liver function tests:  IMPRESSION: Cirrhotic appearance of the liver with trace amount of abdominal ascites and splenomegaly.  Diffuse thickening of the gallbladder wall without evidence of cholelithiasis, likely reactive. His last colonoscopy was in 2016 per records by Dr. Farris Has in  [place]. (anemia) Normal per patient. EGD showed gastritis.   Review  of Systems Past Medical History:  Diagnosis Date  . Anemia   . Diabetes ([facility] [facility])   . Hepatic cirrhosis ([place]) 08/21/2016  . High cholesterol     Past Surgical History:  Procedure Laterality Date  . ESOPHAGOGASTRODUODENOSCOPY N/A 08/01/2016   Procedure: ESOPHAGOGASTRODUODENOSCOPY (EGD);  Surgeon: Rogene Houston, MD;  [facility];  Service: Endoscopy;  Laterality: N/A;  2:45  . HERNIA REPAIR     bilateral inguinal age 32  . KNEE SURGERY     rt arthroscopy    No Known Allergies  Current Outpatient Prescriptions on File Prior to Visit  Medication Sig Dispense Refill  . atorvastatin (LIPITOR) 80 MG tablet Take 80 mg by mouth at bedtime.    . dicyclomine (BENTYL) 10 MG capsule Take 1 capsule (10 mg total) by mouth 3 (three) times daily before meals. 90 capsule 3  . ferrous sulfate 325 (65 FE) MG tablet Take 325 mg by mouth 2 (two) times daily with a meal.    . insulin glargine (LANTUS) 100 UNIT/ML injection Inject 45 Units into the skin at bedtime.    . ISOSORBIDE PO Take 30 mg by mouth daily.    . Magnesium Oxide 400 (240 Mg) MG TABS Take 400 mg by mouth daily.    . metFORMIN (GLUCOPHAGE) 1000 MG tablet Take 1,000 mg by mouth 2 (two) times daily with a meal.    . METOPROLOL TARTRATE PO Take 25 mg by mouth 2 (two) times daily.    Marland Kitchen omeprazole (PRILOSEC) 40 MG capsule Take 40 mg by mouth every evening.    Marland Kitchen OVER THE COUNTER MEDICATION Alphabetic MVI - Patient states that he takes 1 by mouth every morning.    Marland Kitchen PARoxetine (PAXIL) 20 MG tablet Take 20 mg by mouth daily.     No current facility-administered medications on file prior to visit.        Objective:   Physical Exam Blood pressure 120/60, pulse 64, temperature 98.2 F (36.8 C), height 5' 9"  (1.753 m), weight 242 lb 1.6 oz (109.8 kg). Alert and oriented. Skin warm and dry. Oral mucosa is moist.   . Sclera anicteric, conjunctivae is pink. Thyroid not enlarged. No cervical lymphadenopathy. Lungs clear. Heart regular rate  and rhythm.  Abdomen is soft. Bowel sounds are positive. No hepatomegaly. No abdominal masses felt. No tenderness.  No edema to lower extremities         Assessment & Plan:  Diarrhea. Imodium BID, Fiber 4 gms Cirrhosis: CBC, Hepatic function today. Acute hepatitis series, ceruloplasmin, SMA, ANA, PT/INR.  OV in 6 months.

## 2016-08-22 LAB — CBC WITH DIFFERENTIAL/PLATELET
BASOS PCT: 1 %
Basophils Absolute: 39 cells/uL (ref 0–200)
EOS PCT: 2 %
Eosinophils Absolute: 78 cells/uL (ref 15–500)
HCT: 38.2 % — ABNORMAL LOW (ref 38.5–50.0)
Hemoglobin: 12.4 g/dL — ABNORMAL LOW (ref 13.2–17.1)
LYMPHS PCT: 24 %
Lymphs Abs: 936 cells/uL (ref 850–3900)
MCH: 31.1 pg (ref 27.0–33.0)
MCHC: 32.5 g/dL (ref 32.0–36.0)
MCV: 95.7 fL (ref 80.0–100.0)
MONO ABS: 312 {cells}/uL (ref 200–950)
MONOS PCT: 8 %
MPV: 11.3 fL (ref 7.5–12.5)
NEUTROS PCT: 65 %
Neutro Abs: 2535 cells/uL (ref 1500–7800)
PLATELETS: 107 10*3/uL — AB (ref 140–400)
RBC: 3.99 MIL/uL — AB (ref 4.20–5.80)
RDW: 13.9 % (ref 11.0–15.0)
WBC: 3.9 10*3/uL (ref 3.8–10.8)

## 2016-08-22 LAB — ANA: ANA: NEGATIVE

## 2016-08-22 LAB — HEPATIC FUNCTION PANEL
ALT: 31 U/L (ref 9–46)
AST: 44 U/L — ABNORMAL HIGH (ref 10–35)
Albumin: 3.2 g/dL — ABNORMAL LOW (ref 3.6–5.1)
Alkaline Phosphatase: 159 U/L — ABNORMAL HIGH (ref 40–115)
BILIRUBIN DIRECT: 0.3 mg/dL — AB (ref <=0.2)
BILIRUBIN INDIRECT: 0.7 mg/dL (ref 0.2–1.2)
BILIRUBIN TOTAL: 1 mg/dL (ref 0.2–1.2)
Total Protein: 6 g/dL — ABNORMAL LOW (ref 6.1–8.1)

## 2016-08-22 LAB — HEPATITIS PANEL, ACUTE
HCV AB: NEGATIVE
HEP A IGM: NONREACTIVE
HEP B C IGM: NONREACTIVE
HEP B S AG: NEGATIVE

## 2016-08-22 LAB — PROTIME-INR
INR: 1.2 — ABNORMAL HIGH
Prothrombin Time: 12.6 s — ABNORMAL HIGH (ref 9.0–11.5)

## 2016-08-24 LAB — ANTI-SMOOTH MUSCLE ANTIBODY, IGG

## 2016-08-27 ENCOUNTER — Telehealth (INDEPENDENT_AMBULATORY_CARE_PROVIDER_SITE_OTHER): Payer: Self-pay | Admitting: Internal Medicine

## 2016-08-27 NOTE — Telephone Encounter (Signed)
Patient would like results.  [phone]

## 2016-08-28 NOTE — Telephone Encounter (Signed)
Results given to wife. Am waiting on 2 other labs

## 2016-09-06 LAB — ALPHA-1-ANTITRYPSIN: A-1 Antitrypsin, Ser: 99 mg/dL (ref 83–199)

## 2016-09-06 LAB — CERULOPLASMIN: CERULOPLASMIN: 28 mg/dL (ref 18–36)

## 2016-09-12 ENCOUNTER — Telehealth (INDEPENDENT_AMBULATORY_CARE_PROVIDER_SITE_OTHER): Payer: Self-pay | Admitting: Internal Medicine

## 2016-09-12 NOTE — Telephone Encounter (Signed)
Results of rest of lab given to wife.    Larry Hayes, CMET in 3 months

## 2016-09-12 NOTE — Telephone Encounter (Signed)
Patient called, stated that he missed your call because he was out of town.  He stated that you can call him back and if you miss him to leave the message with this wife.  [phone]

## 2016-09-13 ENCOUNTER — Other Ambulatory Visit (INDEPENDENT_AMBULATORY_CARE_PROVIDER_SITE_OTHER): Payer: Self-pay | Admitting: *Deleted

## 2016-09-13 DIAGNOSIS — K7469 Other cirrhosis of liver: Secondary | ICD-10-CM

## 2016-09-13 NOTE — Telephone Encounter (Signed)
C-Met order is completed for 3 months.

## 2016-09-24 ENCOUNTER — Other Ambulatory Visit (INDEPENDENT_AMBULATORY_CARE_PROVIDER_SITE_OTHER): Payer: Self-pay | Admitting: Internal Medicine

## 2016-09-24 DIAGNOSIS — D509 Iron deficiency anemia, unspecified: Secondary | ICD-10-CM

## 2016-09-24 DIAGNOSIS — R197 Diarrhea, unspecified: Secondary | ICD-10-CM

## 2016-10-04 ENCOUNTER — Ambulatory Visit (INDEPENDENT_AMBULATORY_CARE_PROVIDER_SITE_OTHER): Payer: Medicare Other | Admitting: Internal Medicine

## 2016-11-09 HISTORY — PX: CATARACT EXTRACTION: SUR2

## 2016-11-21 ENCOUNTER — Encounter (INDEPENDENT_AMBULATORY_CARE_PROVIDER_SITE_OTHER): Payer: Self-pay | Admitting: Internal Medicine

## 2016-11-21 ENCOUNTER — Ambulatory Visit (INDEPENDENT_AMBULATORY_CARE_PROVIDER_SITE_OTHER): Payer: Medicare Other | Admitting: Internal Medicine

## 2016-11-21 ENCOUNTER — Encounter (INDEPENDENT_AMBULATORY_CARE_PROVIDER_SITE_OTHER): Payer: Self-pay

## 2016-11-21 VITALS — BP 120/60 | HR 76 | Temp 98.4°F | Ht 69.0 in | Wt 254.9 lb

## 2016-11-21 DIAGNOSIS — K7469 Other cirrhosis of liver: Secondary | ICD-10-CM | POA: Diagnosis not present

## 2016-11-21 DIAGNOSIS — K588 Other irritable bowel syndrome: Secondary | ICD-10-CM | POA: Diagnosis not present

## 2016-11-21 DIAGNOSIS — D508 Other iron deficiency anemias: Secondary | ICD-10-CM | POA: Diagnosis not present

## 2016-11-21 LAB — CBC WITH DIFFERENTIAL/PLATELET
BASOS PCT: 0 %
Basophils Absolute: 0 cells/uL (ref 0–200)
Eosinophils Absolute: 84 cells/uL (ref 15–500)
Eosinophils Relative: 2 %
HEMATOCRIT: 39 % (ref 38.5–50.0)
Hemoglobin: 12.7 g/dL — ABNORMAL LOW (ref 13.2–17.1)
LYMPHS PCT: 22 %
Lymphs Abs: 924 cells/uL (ref 850–3900)
MCH: 31.1 pg (ref 27.0–33.0)
MCHC: 32.6 g/dL (ref 32.0–36.0)
MCV: 95.6 fL (ref 80.0–100.0)
MONO ABS: 336 {cells}/uL (ref 200–950)
MPV: 10.8 fL (ref 7.5–12.5)
Monocytes Relative: 8 %
Neutro Abs: 2856 cells/uL (ref 1500–7800)
Neutrophils Relative %: 68 %
PLATELETS: 115 10*3/uL — AB (ref 140–400)
RBC: 4.08 MIL/uL — AB (ref 4.20–5.80)
RDW: 13.3 % (ref 11.0–15.0)
WBC: 4.2 10*3/uL (ref 3.8–10.8)

## 2016-11-21 LAB — HEPATIC FUNCTION PANEL
ALT: 30 U/L (ref 9–46)
AST: 52 U/L — ABNORMAL HIGH (ref 10–35)
Albumin: 3 g/dL — ABNORMAL LOW (ref 3.6–5.1)
Alkaline Phosphatase: 172 U/L — ABNORMAL HIGH (ref 40–115)
BILIRUBIN DIRECT: 0.4 mg/dL — AB (ref <=0.2)
BILIRUBIN INDIRECT: 0.8 mg/dL (ref 0.2–1.2)
BILIRUBIN TOTAL: 1.2 mg/dL (ref 0.2–1.2)
Total Protein: 6 g/dL — ABNORMAL LOW (ref 6.1–8.1)

## 2016-11-21 LAB — PROTIME-INR
INR: 1.2 — AB
Prothrombin Time: 12.7 s — ABNORMAL HIGH (ref 9.0–11.5)

## 2016-11-21 NOTE — Progress Notes (Signed)
Subjective:    Patient ID: Larry PeaceHenry Hayes, male    DOB: 06-27-1946, 70 y.o.   MRN: [id number]  HPI Presents today with c/o diarrhea. He says as soon as he eats he has to go to the bathroom. He says he is weak.  He has a BM about 3 times a day. Stools are brown in color. He actually says his stools are better.  His appetite is okay. No weight loss. Denies any melena or BRRB.  Hx of NAFLD.  Auto immune process ruled out.   08/01/2016 EGD: Indications:              Iron deficiency anemia secondary to chronic blood                            loss, Melena. Patient gives history of duodenal                            ulcer about 10 years ago. Providers:                Lionel DecemberNajeeb Rehman, MD, Edrick Kinsammy Vaught, RN, Birder Robsonebra                            Houghton, Technician                            - Z-line regular, 41 cm from the incisors.                           - Portal hypertensive gastropathy with stigmata of                            bleed                           - Nodular mucosa in the gastric antrum and in the                            prepyloric region of the stomach. Biopsied.                           - Normal duodenal bulb and second portion of the                            duodenum.                           Comment; Suspect patient has been losing blood                            secondary to portal gastropathy.                           He does not have esophageal or gastric varices.                           Patient will return for upper abdominal ultrasound  on 08/02/2016.  CBC    Component Value Date/Time   WBC 3.9 08/21/2016 1613   RBC 3.99 (L) 08/21/2016 1613   HGB 12.4 (L) 08/21/2016 1613   HCT 38.2 (L) 08/21/2016 1613   PLT 107 (L) 08/21/2016 1613   MCV 95.7 08/21/2016 1613   MCH 31.1 08/21/2016 1613   MCHC 32.5 08/21/2016 1613   RDW 13.9 08/21/2016 1613   LYMPHSABS 936 08/21/2016 1613   MONOABS 312 08/21/2016 1613   EOSABS 78 08/21/2016 1613    BASOSABS 39 08/21/2016 1613     Review of Systems Past Medical History:  Diagnosis Date  . Anemia   . Diabetes ([facility])   . 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 High cholesterol   . Thrombocytopenia ([facility]) 08/21/2016    Past Surgical History:  Procedure Laterality Date  . ESOPHAGOGASTRODUODENOSCOPY N/A 08/01/2016   Procedure: ESOPHAGOGASTRODUODENOSCOPY (EGD);  Surgeon: Malissa HippoNajeeb U Rehman, MD;  [facility];  Service: Endoscopy;  Laterality: N/A;  2:45  . HERNIA REPAIR     bilateral inguinal age 70  . KNEE SURGERY     rt arthroscopy    No Known Allergies  Current Outpatient Prescriptions on File Prior to Visit  Medication Sig Dispense Refill  . atorvastatin (LIPITOR) 80 MG tablet Take 80 mg by mouth at bedtime.    . dicyclomine (BENTYL) 10 MG capsule TAKE 1 CAPSULE (10 MG TOTAL) BY MOUTH 3 (THREE) TIMES DAILY BEFORE MEALS. 90 capsule 3  . ferrous sulfate 325 (65 FE) MG tablet Take 325 mg by mouth 2 (two) times daily with a meal.    . insulin glargine (LANTUS) 100 UNIT/ML injection Inject 45 Units into the skin at bedtime.    . ISOSORBIDE PO Take 30 mg by mouth daily.    . Magnesium Oxide 400 (240 Mg) MG TABS Take 400 mg by mouth daily.    . metFORMIN (GLUCOPHAGE) 1000 MG tablet Take 1,000 mg by mouth 2 (two) times daily with a meal.    . METOPROLOL TARTRATE PO Take 25 mg by mouth 2 (two) times daily.    Marland Kitchen. omeprazole (PRILOSEC) 40 MG capsule Take 40 mg by mouth every evening.    Marland Kitchen. OVER THE COUNTER MEDICATION Alphabetic MVI - Patient states that he takes 1 by mouth every morning.    Marland Kitchen. PARoxetine (PAXIL) 20 MG tablet Take 20 mg by mouth daily.     No current facility-administered medications on file prior to visit.        Objective:   Physical Exam Blood pressure 120/60, pulse 76, temperature 98.4 F (36.9 C), height 5\' 9"  (1.753 m), weight 254 lb 14.4 oz (115.6 kg).  Alert and oriented. Skin warm and dry. Oral mucosa is moist.   . Sclera anicteric, conjunctivae is  pink. Thyroid not enlarged. No cervical lymphadenopathy. Lungs clear. Heart regular rate and rhythm.  Abdomen is soft. Bowel sounds are positive. No hepatomegaly. No abdominal masses felt. No tenderness.  No edema to lower extremities.        Assessment & Plan:  IBS, anemia. Am going to get CBC  NAFLD: cirrhosis. Hepatic function. Needs to try to exercise.  OV in 6 months.

## 2016-11-21 NOTE — Patient Instructions (Addendum)
CBC,  Hepatic function.  OV in 6 months 

## 2016-11-26 ENCOUNTER — Other Ambulatory Visit (INDEPENDENT_AMBULATORY_CARE_PROVIDER_SITE_OTHER): Payer: Self-pay | Admitting: *Deleted

## 2016-11-26 DIAGNOSIS — K7469 Other cirrhosis of liver: Secondary | ICD-10-CM

## 2016-11-26 DIAGNOSIS — D508 Other iron deficiency anemias: Secondary | ICD-10-CM

## 2016-11-26 DIAGNOSIS — D696 Thrombocytopenia, unspecified: Secondary | ICD-10-CM

## 2016-12-05 ENCOUNTER — Encounter (INDEPENDENT_AMBULATORY_CARE_PROVIDER_SITE_OTHER): Payer: Self-pay | Admitting: *Deleted

## 2016-12-05 ENCOUNTER — Other Ambulatory Visit (INDEPENDENT_AMBULATORY_CARE_PROVIDER_SITE_OTHER): Payer: Self-pay | Admitting: *Deleted

## 2016-12-05 DIAGNOSIS — K7469 Other cirrhosis of liver: Secondary | ICD-10-CM

## 2016-12-11 ENCOUNTER — Telehealth (INDEPENDENT_AMBULATORY_CARE_PROVIDER_SITE_OTHER): Payer: Self-pay | Admitting: Internal Medicine

## 2016-12-11 NOTE — Telephone Encounter (Signed)
Patient called, stated that he is having swelling in his stomach on the left hand side and it concerns him.  I called him this morning to offer him an appointment for tomorrow and his wife stated that they wanted to know if he should come here or his medical doctor.  She stated it only hurts him if she pushes down on that area.  The wanted your opinion of where he needs to go.  [phone]

## 2016-12-11 NOTE — Telephone Encounter (Signed)
Spoke with patient. Advised to see PCP. [place] in [place]

## 2016-12-12 ENCOUNTER — Telehealth (INDEPENDENT_AMBULATORY_CARE_PROVIDER_SITE_OTHER): Payer: Self-pay | Admitting: Internal Medicine

## 2016-12-12 ENCOUNTER — Other Ambulatory Visit (INDEPENDENT_AMBULATORY_CARE_PROVIDER_SITE_OTHER): Payer: Self-pay | Admitting: Internal Medicine

## 2016-12-12 DIAGNOSIS — R188 Other ascites: Secondary | ICD-10-CM

## 2016-12-12 MED ORDER — FUROSEMIDE 20 MG PO TABS: 20.0000 mg | ORAL_TABLET | Freq: Every day | ORAL | 4 refills | Status: DC

## 2016-12-12 MED ORDER — SPIRONOLACTONE 50 MG PO TABS: 50.0000 mg | ORAL_TABLET | Freq: Every day | ORAL | 4 refills | Status: DC

## 2016-12-12 NOTE — Telephone Encounter (Signed)
[facility]

## 2016-12-12 NOTE — Telephone Encounter (Signed)
Patient called late last night and left a message on our voice mail, stating that he went to the ER at [facility] yesterday.  He stated that he had a CT of his stomach, ER doctor thinks it's coming from his cirrhosis.  The ER gave him two antibiotics in his vein last night and sent him home with one prescription.  I spoke to his spouse this morning and she stated that they did more blood work at the hospital last night, therefore, do they still need to have blood work done this Friday (they had received a letter for blood work from our office to go 12/14/16).  His spouse would like to know if he needs to come in and see you or hold off.  [phone]

## 2016-12-12 NOTE — Telephone Encounter (Signed)
I've called [facility] for the records.

## 2016-12-12 NOTE — Telephone Encounter (Signed)
Tammy, CMET and CBC in 2 weeks.

## 2016-12-12 NOTE — Telephone Encounter (Signed)
US paracentesis sch'd 12/13/16 @ 215 (200), patient aware

## 2016-12-12 NOTE — Telephone Encounter (Signed)
Hope, needs OV in next  3 weeks

## 2016-12-12 NOTE — Telephone Encounter (Signed)
Dewayne HatchAnn, US paracentesis tomorrow

## 2016-12-13 ENCOUNTER — Encounter (HOSPITAL_COMMUNITY): Payer: Self-pay

## 2016-12-13 ENCOUNTER — Ambulatory Visit (HOSPITAL_COMMUNITY)
Admission: RE | Admit: 2016-12-13 | Discharge: 2016-12-13 | Disposition: A | Discharge status: Home / Self Care | Payer: Medicare Other | Source: Ambulatory Visit | Attending: Internal Medicine | Admitting: Internal Medicine

## 2016-12-13 DIAGNOSIS — R188 Other ascites: Secondary | ICD-10-CM | POA: Diagnosis present

## 2016-12-13 DIAGNOSIS — K760 Fatty (change of) liver, not elsewhere classified: Secondary | ICD-10-CM | POA: Insufficient documentation

## 2016-12-13 LAB — PROTEIN, BODY FLUID

## 2016-12-13 LAB — BODY FLUID CELL COUNT WITH DIFFERENTIAL
Eos, Fluid: 0 %
Lymphs, Fluid: 63 %
Monocyte-Macrophage-Serous Fluid: 35 % — ABNORMAL LOW (ref 50–90)
NEUTROPHIL FLUID: 2 % (ref 0–25)
Other Cells, Fluid: 0 %
Total Nucleated Cell Count, Fluid: 317 cu mm (ref 0–1000)

## 2016-12-13 LAB — GRAM STAIN
Gram Stain: NONE SEEN
SPECIAL REQUESTS: 50

## 2016-12-13 LAB — GLUCOSE, SEROUS FLUID: Glucose, Fluid: 180 mg/dL

## 2016-12-13 NOTE — Progress Notes (Signed)
Paracentesis complete no signs of distress. 4L yellow colored ascites removed.  

## 2016-12-13 NOTE — Procedures (Signed)
PreOperative Dx: Cirrhosis, ascites Postoperative Dx: Cirrhosis, ascites Procedure:   US guided paracentesis Radiologist:  Tyron RussellBoles Anesthesia:  10 ml of1% lidocaine Specimen:  4 L of clear yellow ascitic fluid EBL:   < 1 ml Complications: None

## 2016-12-14 ENCOUNTER — Other Ambulatory Visit (INDEPENDENT_AMBULATORY_CARE_PROVIDER_SITE_OTHER): Payer: Self-pay | Admitting: *Deleted

## 2016-12-14 ENCOUNTER — Encounter (INDEPENDENT_AMBULATORY_CARE_PROVIDER_SITE_OTHER): Payer: Self-pay | Admitting: *Deleted

## 2016-12-14 ENCOUNTER — Encounter (INDEPENDENT_AMBULATORY_CARE_PROVIDER_SITE_OTHER): Payer: Self-pay

## 2016-12-14 DIAGNOSIS — D508 Other iron deficiency anemias: Secondary | ICD-10-CM

## 2016-12-14 DIAGNOSIS — D696 Thrombocytopenia, unspecified: Secondary | ICD-10-CM

## 2016-12-14 DIAGNOSIS — K921 Melena: Secondary | ICD-10-CM

## 2016-12-14 NOTE — Telephone Encounter (Signed)
Lab is noted for 12/28/2016. A letter has been sent as a reminder to the patient.

## 2016-12-20 NOTE — Telephone Encounter (Signed)
err

## 2016-12-24 NOTE — Telephone Encounter (Signed)
Patient was given an appointment for 01/03/17 at 11:15am with Deberah Castle, NP.  The patient was called and advised.

## 2016-12-28 LAB — CBC
HCT: 36.6 % — ABNORMAL LOW (ref 38.5–50.0)
Hemoglobin: 11.8 g/dL — ABNORMAL LOW (ref 13.2–17.1)
MCH: 30.6 pg (ref 27.0–33.0)
MCHC: 32.2 g/dL (ref 32.0–36.0)
MCV: 95.1 fL (ref 80.0–100.0)
MPV: 10.8 fL (ref 7.5–12.5)
PLATELETS: 122 10*3/uL — AB (ref 140–400)
RBC: 3.85 MIL/uL — ABNORMAL LOW (ref 4.20–5.80)
RDW: 13.9 % (ref 11.0–15.0)
WBC: 3.8 10*3/uL (ref 3.8–10.8)

## 2016-12-28 LAB — COMPREHENSIVE METABOLIC PANEL
ALK PHOS: 174 U/L — AB (ref 40–115)
ALT: 30 U/L (ref 9–46)
AST: 52 U/L — AB (ref 10–35)
Albumin: 2.7 g/dL — ABNORMAL LOW (ref 3.6–5.1)
BUN: 10 mg/dL (ref 7–25)
CALCIUM: 8.8 mg/dL (ref 8.6–10.3)
CO2: 29 mmol/L (ref 20–31)
Chloride: 105 mmol/L (ref 98–110)
Creat: 0.88 mg/dL (ref 0.70–1.18)
GLUCOSE: 109 mg/dL — AB (ref 65–99)
Potassium: 4.1 mmol/L (ref 3.5–5.3)
Sodium: 139 mmol/L (ref 135–146)
TOTAL PROTEIN: 5.9 g/dL — AB (ref 6.1–8.1)
Total Bilirubin: 1.2 mg/dL (ref 0.2–1.2)

## 2017-01-03 ENCOUNTER — Ambulatory Visit (INDEPENDENT_AMBULATORY_CARE_PROVIDER_SITE_OTHER): Payer: Medicare Other | Admitting: Internal Medicine

## 2017-01-03 ENCOUNTER — Encounter (INDEPENDENT_AMBULATORY_CARE_PROVIDER_SITE_OTHER): Payer: Self-pay | Admitting: *Deleted

## 2017-01-03 ENCOUNTER — Encounter (INDEPENDENT_AMBULATORY_CARE_PROVIDER_SITE_OTHER): Payer: Self-pay | Admitting: Internal Medicine

## 2017-01-03 VITALS — BP 128/50 | HR 80 | Temp 97.8°F | Ht 69.0 in | Wt 246.0 lb

## 2017-01-03 DIAGNOSIS — R188 Other ascites: Secondary | ICD-10-CM

## 2017-01-03 DIAGNOSIS — K760 Fatty (change of) liver, not elsewhere classified: Secondary | ICD-10-CM | POA: Diagnosis not present

## 2017-01-03 LAB — CBC WITH DIFFERENTIAL/PLATELET
BASOS PCT: 1 %
Basophils Absolute: 46 cells/uL (ref 0–200)
EOS ABS: 92 {cells}/uL (ref 15–500)
EOS PCT: 2 %
HCT: 37 % — ABNORMAL LOW (ref 38.5–50.0)
Hemoglobin: 11.8 g/dL — ABNORMAL LOW (ref 13.2–17.1)
Lymphocytes Relative: 18 %
Lymphs Abs: 828 cells/uL — ABNORMAL LOW (ref 850–3900)
MCH: 30.6 pg (ref 27.0–33.0)
MCHC: 31.9 g/dL — ABNORMAL LOW (ref 32.0–36.0)
MCV: 96.1 fL (ref 80.0–100.0)
MPV: 10.9 fL (ref 7.5–12.5)
Monocytes Absolute: 460 cells/uL (ref 200–950)
Monocytes Relative: 10 %
NEUTROS ABS: 3174 {cells}/uL (ref 1500–7800)
Neutrophils Relative %: 69 %
PLATELETS: 130 10*3/uL — AB (ref 140–400)
RBC: 3.85 MIL/uL — ABNORMAL LOW (ref 4.20–5.80)
RDW: 13.7 % (ref 11.0–15.0)
WBC: 4.6 10*3/uL (ref 3.8–10.8)

## 2017-01-03 MED ORDER — SPIRONOLACTONE 50 MG PO TABS: 100.0000 mg | ORAL_TABLET | Freq: Every day | ORAL | 4 refills | Status: DC

## 2017-01-03 MED ORDER — FUROSEMIDE 40 MG PO TABS
40.0000 mg | ORAL_TABLET | Freq: Every day | ORAL | 11 refills | Status: DC
Start: 2017-01-03 — End: 2017-04-02

## 2017-01-03 NOTE — Progress Notes (Signed)
Subjective:    Patient ID: Larry Hayes, male    DOB: 20-Jun-1946, 71 y.o.   MRN: [id number] Wt in [place] HPI here today for f/u. Underwent a paracentesis 12/14/2015 with removal of 4 liters. Fluid analysis was negative.  Hx of NAFLD/cirrhosis. Auto immune process ruled out.  Hepatitis panel was negative.  He tells me he is doing good. He states his belly  Is distended. If he goes outside, he gives out. His appetite is good. Weight in December was 254. Today his weight is 246. He has BM about twice a day.   08/01/2016 EGD: Indications: Iron deficiency anemia secondary to chronic blood  loss, Melena. Patient gives history of duodenal  ulcer about 10 years ago. Providers: Lionel DecemberNajeeb Rehman, MD, Edrick Kinsammy Vaught, RN, Birder Robsonebra  Houghton, Technician  - Z-line regular, 41 cm from the incisors. - Portal hypertensive gastropathy with stigmata of  bleed - Nodular mucosa in the gastric antrum and in the  prepyloric region of the stomach. Biopsied. - Normal duodenal bulb and second portion of the  duodenum. Comment; Suspect patient has been losing blood  secondary to portal gastropathy. He does not have esophageal or gastric varices. Patient will return for upper abdominal ultrasound on 08/02/2016.   08/02/2016 US abdomen: increased liver function tests:  IMPRESSION: Cirrhotic appearance of the liver with trace amount of abdominal ascites and splenomegaly.  Diffuse thickening of the gallbladder wall without evidence of cholelithiasis, likely reactive. His last colonoscopy was in 2016  per records by Dr. Carmon Ginsberg'Neil in [place]. (anemia) Normal per patient. EGD showed gastritis.     Review of Systems Past Medical History:  Diagnosis Date  . Anemia   . Diabetes ([facility])   . 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 High cholesterol   . Thrombocytopenia ([facility]) 08/21/2016    Past Surgical History:  Procedure Laterality Date  . ESOPHAGOGASTRODUODENOSCOPY N/A 08/01/2016   Procedure: ESOPHAGOGASTRODUODENOSCOPY (EGD);  Surgeon: Malissa HippoNajeeb U Rehman, MD;  [facility];  Service: Endoscopy;  Laterality: N/A;  2:45  . HERNIA REPAIR     bilateral inguinal age 71  . KNEE SURGERY     rt arthroscopy    No Known Allergies  Current Outpatient Prescriptions on File Prior to Visit  Medication Sig Dispense Refill  . atorvastatin (LIPITOR) 80 MG tablet Take 80 mg by mouth at bedtime.    . dicyclomine (BENTYL) 10 MG capsule TAKE 1 CAPSULE (10 MG TOTAL) BY MOUTH 3 (THREE) TIMES DAILY BEFORE MEALS. 90 capsule 3  . ferrous sulfate 325 (65 FE) MG tablet Take 325 mg by mouth 2 (two) times daily with a meal.    . furosemide (LASIX) 20 MG tablet Take 1 tablet (20 mg total) by mouth daily. 30 tablet 4  . insulin glargine (LANTUS) 100 UNIT/ML injection Inject 30 Units into the skin at bedtime.     . ISOSORBIDE PO Take 30 mg by mouth daily.    . Magnesium Oxide 400 (240 Mg) MG TABS Take 400 mg by mouth daily.    . metFORMIN (GLUCOPHAGE) 1000 MG tablet Take 1,000 mg by mouth daily with breakfast.     . METOPROLOL TARTRATE PO Take 25 mg by mouth 2 (two) times daily.    Marland Kitchen. omeprazole (PRILOSEC) 40 MG capsule Take 40 mg by mouth every evening.    Marland Kitchen. OVER THE COUNTER MEDICATION Alphabetic MVI - Patient states that he takes 1 by mouth every morning.    Marland Kitchen. PARoxetine (PAXIL) 20 MG tablet Take 20 mg  by mouth daily.    Marland Kitchen spironolactone (ALDACTONE) 50 MG tablet Take 1 tablet (50 mg total) by mouth daily. 30 tablet 4   No current facility-administered medications on file prior to visit.          Objective:   Physical Exam Blood pressure (!) 128/50, pulse 80, temperature 97.8 F (36.6 C), height 5\' 9"  (1.753 m), weight 246 lb (111.6 kg). Alert and oriented. Skin warm and dry. Oral mucosa is moist.   . Sclera anicteric, conjunctivae is pink. Thyroid not enlarged. No cervical lymphadenopathy. Lungs clear. Heart regular rate and rhythm.  Abdomen is soft. Bowel sounds are positive. No hepatomegaly. No abdominal masses felt. No tenderness.  No edema to lower extremities.  Abdomen is distended, but not tense. Patient is requesting paracentesis.         Assessment & Plan:  NAFLD with ascites. Will schedule an US paracentesis.  CBC, and CMET. Lasix increased to 40mg  and Spironolactone increased to 100mg  OV in 3 months with Dr Karilyn Cota

## 2017-01-03 NOTE — Patient Instructions (Signed)
Increase spironalactone to 130m day. Lasix to 466mday. OV in 3 months

## 2017-01-04 ENCOUNTER — Ambulatory Visit (HOSPITAL_COMMUNITY)
Admission: RE | Admit: 2017-01-04 | Discharge: 2017-01-04 | Disposition: A | Discharge status: Home / Self Care | Payer: Medicare Other | Source: Ambulatory Visit | Attending: Internal Medicine | Admitting: Internal Medicine

## 2017-01-04 ENCOUNTER — Encounter (HOSPITAL_COMMUNITY): Payer: Self-pay

## 2017-01-04 DIAGNOSIS — R188 Other ascites: Secondary | ICD-10-CM

## 2017-01-04 DIAGNOSIS — K7460 Unspecified cirrhosis of liver: Secondary | ICD-10-CM | POA: Insufficient documentation

## 2017-01-04 DIAGNOSIS — K7581 Nonalcoholic steatohepatitis (NASH): Secondary | ICD-10-CM | POA: Insufficient documentation

## 2017-01-04 LAB — GRAM STAIN: Gram Stain: NONE SEEN

## 2017-01-04 LAB — BODY FLUID CELL COUNT WITH DIFFERENTIAL
Eos, Fluid: 0 %
LYMPHS FL: 81 %
Monocyte-Macrophage-Serous Fluid: 17 % — ABNORMAL LOW (ref 50–90)
NEUTROPHIL FLUID: 2 % (ref 0–25)
Other Cells, Fluid: 0 %
WBC FLUID: 388 uL (ref 0–1000)

## 2017-01-04 NOTE — Procedures (Signed)
PreOperative Dx: Cirrhosis, NASH, ascites Postoperative Dx: Cirrhosis, NASH, ascites Procedure:   US guided paracentesis Radiologist:  Tyron RussellBoles Anesthesia:  10 ml of1% lidocaine Specimen:  3.7 L of yellow ascitic fluid EBL:   < 1 ml Complications: None

## 2017-01-04 NOTE — Progress Notes (Signed)
Paracentesis complete no signs of distress. 3.7L yellow colored ascites removed.

## 2017-01-07 LAB — PATHOLOGIST SMEAR REVIEW

## 2017-01-09 LAB — CULTURE, BODY FLUID W GRAM STAIN -BOTTLE: Culture: NO GROWTH

## 2017-01-09 LAB — CULTURE, BODY FLUID-BOTTLE

## 2017-01-15 ENCOUNTER — Other Ambulatory Visit (INDEPENDENT_AMBULATORY_CARE_PROVIDER_SITE_OTHER): Payer: Self-pay | Admitting: *Deleted

## 2017-01-15 DIAGNOSIS — K760 Fatty (change of) liver, not elsewhere classified: Secondary | ICD-10-CM

## 2017-01-26 LAB — ACID FAST CULTURE WITH REFLEXED SENSITIVITIES

## 2017-01-26 LAB — ACID FAST CULTURE WITH REFLEXED SENSITIVITIES (MYCOBACTERIA): Acid Fast Culture: NEGATIVE

## 2017-01-29 LAB — CULTURE, BODY FLUID W GRAM STAIN -BOTTLE: Culture: NO GROWTH

## 2017-01-29 LAB — CULTURE, BODY FLUID-BOTTLE

## 2017-02-04 ENCOUNTER — Encounter (INDEPENDENT_AMBULATORY_CARE_PROVIDER_SITE_OTHER): Payer: Self-pay | Admitting: *Deleted

## 2017-02-04 ENCOUNTER — Other Ambulatory Visit (INDEPENDENT_AMBULATORY_CARE_PROVIDER_SITE_OTHER): Payer: Self-pay | Admitting: *Deleted

## 2017-02-04 DIAGNOSIS — D696 Thrombocytopenia, unspecified: Secondary | ICD-10-CM

## 2017-02-04 DIAGNOSIS — D508 Other iron deficiency anemias: Secondary | ICD-10-CM

## 2017-02-04 DIAGNOSIS — K7469 Other cirrhosis of liver: Secondary | ICD-10-CM

## 2017-02-11 ENCOUNTER — Telehealth (INDEPENDENT_AMBULATORY_CARE_PROVIDER_SITE_OTHER): Payer: Self-pay | Admitting: Internal Medicine

## 2017-02-11 NOTE — Telephone Encounter (Signed)
Patient called, wants to have a paracentesis done and would like to discuss having some blood work with you.  [phone]

## 2017-02-11 NOTE — Telephone Encounter (Signed)
Blood work has already been ordered. He will come by Wednesday and let me look at his abdomen.

## 2017-02-14 LAB — HEPATIC FUNCTION PANEL
ALBUMIN: 3.1 g/dL — AB (ref 3.6–5.1)
ALT: 34 U/L (ref 9–46)
AST: 44 U/L — AB (ref 10–35)
Alkaline Phosphatase: 161 U/L — ABNORMAL HIGH (ref 40–115)
BILIRUBIN DIRECT: 0.3 mg/dL — AB (ref <=0.2)
Indirect Bilirubin: 0.6 mg/dL (ref 0.2–1.2)
TOTAL PROTEIN: 6.7 g/dL (ref 6.1–8.1)
Total Bilirubin: 0.9 mg/dL (ref 0.2–1.2)

## 2017-02-14 LAB — CBC
HEMATOCRIT: 37.8 % — AB (ref 38.5–50.0)
HEMOGLOBIN: 12.3 g/dL — AB (ref 13.2–17.1)
MCH: 31.5 pg (ref 27.0–33.0)
MCHC: 32.5 g/dL (ref 32.0–36.0)
MCV: 96.9 fL (ref 80.0–100.0)
MPV: 12.2 fL (ref 7.5–12.5)
Platelets: 110 10*3/uL — ABNORMAL LOW (ref 140–400)
RBC: 3.9 MIL/uL — ABNORMAL LOW (ref 4.20–5.80)
RDW: 14.2 % (ref 11.0–15.0)
WBC: 3.8 10*3/uL (ref 3.8–10.8)

## 2017-02-14 LAB — AFP TUMOR MARKER: AFP TUMOR MARKER: 2.7 ng/mL (ref <6.1)

## 2017-02-18 ENCOUNTER — Other Ambulatory Visit (INDEPENDENT_AMBULATORY_CARE_PROVIDER_SITE_OTHER): Payer: Self-pay | Admitting: Internal Medicine

## 2017-02-18 ENCOUNTER — Ambulatory Visit (INDEPENDENT_AMBULATORY_CARE_PROVIDER_SITE_OTHER): Payer: Medicare Other | Admitting: Internal Medicine

## 2017-02-18 DIAGNOSIS — D509 Iron deficiency anemia, unspecified: Secondary | ICD-10-CM

## 2017-02-18 DIAGNOSIS — R197 Diarrhea, unspecified: Secondary | ICD-10-CM

## 2017-04-02 ENCOUNTER — Encounter (INDEPENDENT_AMBULATORY_CARE_PROVIDER_SITE_OTHER): Payer: Self-pay | Admitting: Internal Medicine

## 2017-04-02 ENCOUNTER — Ambulatory Visit (INDEPENDENT_AMBULATORY_CARE_PROVIDER_SITE_OTHER): Payer: Medicare Other | Admitting: Internal Medicine

## 2017-04-02 VITALS — BP 120/70 | HR 72 | Temp 97.3°F | Resp 18 | Ht 69.0 in | Wt 220.2 lb

## 2017-04-02 DIAGNOSIS — K760 Fatty (change of) liver, not elsewhere classified: Secondary | ICD-10-CM

## 2017-04-02 DIAGNOSIS — K7460 Unspecified cirrhosis of liver: Secondary | ICD-10-CM | POA: Diagnosis not present

## 2017-04-02 DIAGNOSIS — K7581 Nonalcoholic steatohepatitis (NASH): Secondary | ICD-10-CM | POA: Diagnosis not present

## 2017-04-02 DIAGNOSIS — R188 Other ascites: Secondary | ICD-10-CM

## 2017-04-02 DIAGNOSIS — R630 Anorexia: Secondary | ICD-10-CM | POA: Diagnosis not present

## 2017-04-02 LAB — BASIC METABOLIC PANEL
BUN: 15 mg/dL (ref 7–25)
CHLORIDE: 99 mmol/L (ref 98–110)
CO2: 26 mmol/L (ref 20–31)
Calcium: 9.4 mg/dL (ref 8.6–10.3)
Creat: 1.04 mg/dL (ref 0.70–1.18)
Glucose, Bld: 262 mg/dL — ABNORMAL HIGH (ref 65–99)
POTASSIUM: 4.5 mmol/L (ref 3.5–5.3)
Sodium: 136 mmol/L (ref 135–146)

## 2017-04-02 MED ORDER — SPIRONOLACTONE 50 MG PO TABS: 50.0000 mg | ORAL_TABLET | Freq: Every day | ORAL | 5 refills | Status: DC

## 2017-04-02 MED ORDER — FUROSEMIDE 40 MG PO TABS: 40.0000 mg | ORAL_TABLET | ORAL | 1 refills | Status: DC

## 2017-04-02 NOTE — Patient Instructions (Addendum)
Remember to exercise or walk every day as tolerated. Physician will call with results of blood tests. Check weight every 3-4 days. Call if you have gained more than 5 pounds.

## 2017-04-02 NOTE — Progress Notes (Signed)
Presenting complaint;  Follow-up for decompensated chronic liver disease  Database and Subjective:  Patient is 71 year old Caucasian male who was evaluated and are office in August last year for iron deficiency anemia and history of melena. He had undergone prior colonoscopy. EGD revealed changes of portal hypertensive gastropathy. His transaminases were mildly abnormal. He underwent ultrasound also in August 2017 revealing nodular diffusely heterogeneous liver splenomegaly and trace ascites. Gallbladder wall was thickened without evidence of cholelithiasis. Biochemical markers for various etiologies were negative. 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 He presented again in January with progressive abdominal distention and underwent LVAP twice about 3 weeks apart. He was begun on a diuretic therapy.  Today patient is accompanied by his wife. His wife states that he lost his appetite about 3 weeks ago. He generally has 1 meal per day. However he is not having hypoglycemic spells despite being on insulin. Insulin dose was reduced 2 weeks ago. He reports mild discomfort in both breasts. He states his nipples are sore. He has lost 26 pounds in the last 3 months. He is has nausea but no vomiting. He denies heartburn dysphagia abdominal pain melena or rectal bleeding. He is having 3-4 bowel movements per day. He is still having accidents usually during the daytime. His wife states that he does not do much physical activity. He sister a plantar watches TV. However he did drive to [place] for a Charity fundraiser. He played some. His wife stated that he is having memory problems. However he has not been confused. Patient states metformin dose was reduced but he doesn't believe it helped with diarrhea. He does not take OTC NSAIDs.   Current Medications: Outpatient Encounter Prescriptions as of 04/02/2017  Medication Sig  . atorvastatin (LIPITOR) 80 MG tablet Take 80 mg by mouth at bedtime.  .  dicyclomine (BENTYL) 10 MG capsule TAKE 1 CAPSULE BY MOUTH THREE TIMES A DAY BEFORE MEALS  . ferrous sulfate 325 (65 FE) MG tablet Take 325 mg by mouth 2 (two) times daily with a meal.  . furosemide (LASIX) 40 MG tablet Take 1 tablet (40 mg total) by mouth daily.  . insulin glargine (LANTUS) 100 UNIT/ML injection Inject 30 Units into the skin at bedtime.   . ISOSORBIDE PO Take 30 mg by mouth daily.  . Magnesium Oxide 400 (240 Mg) MG TABS Take 400 mg by mouth daily.  . metFORMIN (GLUCOPHAGE) 1000 MG tablet Take 1,000 mg by mouth daily with breakfast.   . METOPROLOL TARTRATE PO Take 25 mg by mouth 2 (two) times daily.  Marland Kitchen omeprazole (PRILOSEC) 40 MG capsule Take 40 mg by mouth every evening.  Marland Kitchen OVER THE COUNTER MEDICATION Alphabetic MVI - Patient states that he takes 1 by mouth every morning.  Marland Kitchen PARoxetine (PAXIL) 20 MG tablet Take 20 mg by mouth daily.  Marland Kitchen spironolactone (ALDACTONE) 50 MG tablet Take 2 tablets (100 mg total) by mouth daily.   No facility-administered encounter medications on file as of 04/02/2017.      Objective: Blood pressure 120/70, pulse 72, temperature 97.3 F (36.3 C), temperature source Oral, resp. rate 18, height 5' 9"  (1.753 m), weight 220 lb 3.2 oz (99.9 kg). Patient is alert and in no acute distress. He does not have asterixis. Conjunctiva is pink. Sclera is nonicteric Oropharyngeal mucosa is dry otherwise normal. No neck masses or thyromegaly noted. He has mild tender gynecomastia. Cardiac exam with regular rhythm normal S1 and S2. No murmur or gallop noted. Lungs are  clear to auscultation. Abdomen is full. Shifting dullness is absent. It soft and nontender without organomegaly or masses. No LE edema or clubbing noted.  Labs/studies Results:  EGD on 08/01/2016 for iron deficiency anemia and melena revealed mottled portal hypertensive gastropathy. Nodular antral mucosa and biopsy revealed for the older hyperplasia.  Abdominopelvic CT on 12/14/2016 revealed  massive ascites and cirrhotic liver.  AFP was 2.7 on 02/13/2017.  Assessment:  #1. 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 Clinically he does not have ascites today. He has lost 26 pounds since his last visit. He may be on the dry site. Therefore need to back off on diuretic therapy. #2. Anorexia. Not sure why he is anorexic but less it is due to chronic liver disease or his medications. #3. Memory impairment. Will check serum ammonia. #4. Mild gynecomastia secondary to spironolactone.  Plan:  Patient encouraged to do regular exerciser walk as tolerated. If nothing else it would help for this appetite. Decrease furosemide to 40 mg 3 times a week. Decrease spironolactone to 50 mg by mouth daily. Patient will go to the lab for serum ammonia and the Metzenbaums. Patient advised to check weight every 3-4 days and call if he has gained more than 5 pounds. Office visit in 3 months.

## 2017-04-03 LAB — AMMONIA: AMMONIA: 45 umol/L (ref <=47)

## 2017-04-16 ENCOUNTER — Encounter (INDEPENDENT_AMBULATORY_CARE_PROVIDER_SITE_OTHER): Payer: Self-pay | Admitting: Internal Medicine

## 2017-04-16 NOTE — Progress Notes (Signed)
An appointment was given for 07/23/17 at 2:15pm with Dr. Laural Golden.  A letter was mailed to the patient.

## 2017-04-17 ENCOUNTER — Encounter (INDEPENDENT_AMBULATORY_CARE_PROVIDER_SITE_OTHER): Payer: Self-pay | Admitting: Internal Medicine

## 2017-04-17 ENCOUNTER — Telehealth (INDEPENDENT_AMBULATORY_CARE_PROVIDER_SITE_OTHER): Payer: Self-pay | Admitting: *Deleted

## 2017-04-17 NOTE — Telephone Encounter (Signed)
Apr 16, 2017  Dr. Karilyn Cotaehman requested that we give him an update on Ivy's condition after his last visit   April 24. We had a scare on April 26 as Mckyle's diabetes score was so high his meter would not register. [place] visited the [place] at [place] and his number was [phone]. They kept him in the ER five hours and got his score down to 288. Sherilyn CooterHenry called his family doctor on Monday, April 30 and he increased his insulin and put him back on the Metformin to get his sugar back in the normal range. His weight is staying consistent between 216 and 220 pounds. He is still very lethargic and is slightly purple around his eyes (probably allergies) but is trying to do a few things such as getting up trash, mowing using the riding lawn mower and loading the dishwasher. If any questions, please call us at [phone]. Thank you all so very much.

## 2017-04-25 ENCOUNTER — Encounter (INDEPENDENT_AMBULATORY_CARE_PROVIDER_SITE_OTHER): Payer: Self-pay | Admitting: Internal Medicine

## 2017-04-26 ENCOUNTER — Other Ambulatory Visit (INDEPENDENT_AMBULATORY_CARE_PROVIDER_SITE_OTHER): Payer: Self-pay | Admitting: *Deleted

## 2017-04-26 DIAGNOSIS — K7460 Unspecified cirrhosis of liver: Secondary | ICD-10-CM

## 2017-04-26 NOTE — Telephone Encounter (Signed)
Per Dr.Rehman - the patient soul have ammonia level checked. A order has been sent and his wife ws made aware. They plan tho get this done on Monday. Also suggested that the patient be checked about his Paxil. The dosage my be to little or to much. Wife made aware. Patient will be called with Ammonia Result.

## 2017-04-30 LAB — AMMONIA: AMMONIA: 44 umol/L (ref <=47)

## 2017-05-03 ENCOUNTER — Encounter (INDEPENDENT_AMBULATORY_CARE_PROVIDER_SITE_OTHER): Payer: Self-pay | Admitting: Internal Medicine

## 2017-05-22 ENCOUNTER — Ambulatory Visit (INDEPENDENT_AMBULATORY_CARE_PROVIDER_SITE_OTHER): Payer: Medicare Other | Admitting: Internal Medicine

## 2017-06-18 ENCOUNTER — Other Ambulatory Visit (INDEPENDENT_AMBULATORY_CARE_PROVIDER_SITE_OTHER): Payer: Self-pay | Admitting: Internal Medicine

## 2017-06-18 DIAGNOSIS — D509 Iron deficiency anemia, unspecified: Secondary | ICD-10-CM

## 2017-06-18 DIAGNOSIS — R197 Diarrhea, unspecified: Secondary | ICD-10-CM

## 2017-07-04 ENCOUNTER — Ambulatory Visit (INDEPENDENT_AMBULATORY_CARE_PROVIDER_SITE_OTHER): Payer: Medicare Other | Admitting: Internal Medicine

## 2017-07-23 ENCOUNTER — Encounter (INDEPENDENT_AMBULATORY_CARE_PROVIDER_SITE_OTHER): Payer: Self-pay | Admitting: *Deleted

## 2017-07-23 ENCOUNTER — Encounter (INDEPENDENT_AMBULATORY_CARE_PROVIDER_SITE_OTHER): Payer: Self-pay | Admitting: Internal Medicine

## 2017-07-23 ENCOUNTER — Ambulatory Visit (INDEPENDENT_AMBULATORY_CARE_PROVIDER_SITE_OTHER): Payer: Medicare Other | Admitting: Internal Medicine

## 2017-07-23 VITALS — BP 110/80 | HR 67 | Temp 97.6°F | Resp 18 | Ht 69.0 in | Wt 231.6 lb

## 2017-07-23 DIAGNOSIS — K7581 Nonalcoholic steatohepatitis (NASH): Secondary | ICD-10-CM

## 2017-07-23 DIAGNOSIS — K7460 Unspecified cirrhosis of liver: Secondary | ICD-10-CM

## 2017-07-23 DIAGNOSIS — R188 Other ascites: Secondary | ICD-10-CM

## 2017-07-23 LAB — CBC
HCT: 37.5 % — ABNORMAL LOW (ref 38.5–50.0)
Hemoglobin: 12.7 g/dL — ABNORMAL LOW (ref 13.2–17.1)
MCH: 33 pg (ref 27.0–33.0)
MCHC: 33.9 g/dL (ref 32.0–36.0)
MCV: 97.4 fL (ref 80.0–100.0)
MPV: 10.2 fL (ref 7.5–12.5)
PLATELETS: 108 10*3/uL — AB (ref 140–400)
RBC: 3.85 MIL/uL — ABNORMAL LOW (ref 4.20–5.80)
RDW: 13.4 % (ref 11.0–15.0)
WBC: 4.6 10*3/uL (ref 3.8–10.8)

## 2017-07-23 NOTE — Progress Notes (Signed)
Presenting complaint;  Follow-up for chronic liver disease.  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 He was last seen on 04/02/2017 when he weighed 221 pounds and today he was 231 pounds. He continues to complain of feeling tired and does not do much physical activity. However he remains with good appetite. He has gained 11 pounds since his last visit. He feels his abdomen and may be more distended than in the past. Has lower extremity edema. His bowels move twice daily. He denies melena or rectal bleeding. His wife states at times at least forgetful but he has not been confused. He had left cataract extraction 2 weeks ago. He did not have improvement in his vision. He was told that he or clot behind his legs and it showed dissolve in vision should improve. Patient drinks 2-3 cans of 16 ounce Coke bottles every day.   Current Medications: Outpatient Encounter Prescriptions as of 07/23/2017  Medication Sig  . aspirin EC 81 MG tablet Take 81 mg by mouth daily.  Marland Kitchen atorvastatin (LIPITOR) 80 MG tablet Take 80 mg by mouth at bedtime.  . dicyclomine (BENTYL) 10 MG capsule TAKE 1 CAPSULE BY MOUTH THREE TIMES A DAY BEFORE MEALS  . ferrous sulfate 325 (65 FE) MG tablet Take 325 mg by mouth 2 (two) times daily with a meal.  . furosemide (LASIX) 40 MG tablet Take 1 tablet (40 mg total) by mouth 3 (three) times a week.  . insulin glargine (LANTUS) 100 UNIT/ML injection Inject 40 Units into the skin at bedtime.   . metFORMIN (GLUCOPHAGE) 1000 MG tablet Take 1,000 mg by mouth daily with breakfast.   . METOPROLOL TARTRATE PO Take 25 mg by mouth 2 (two) times daily.  Marland Kitchen omeprazole (PRILOSEC) 40 MG capsule Take 40 mg by mouth every morning.   Marland Kitchen OVER THE COUNTER MEDICATION Alphabetic MVI - Patient states that he takes 1 by mouth every morning.  . sertraline (ZOLOFT) 50 MG tablet Take 50 mg by  mouth daily.   . [DISCONTINUED] ISOSORBIDE PO Take 30 mg by mouth daily.  . [DISCONTINUED] Magnesium Oxide 400 (240 Mg) MG TABS Take 400 mg by mouth daily.  . [DISCONTINUED] PARoxetine (PAXIL) 20 MG tablet Take 20 mg by mouth daily.  . [DISCONTINUED] spironolactone (ALDACTONE) 50 MG tablet Take 1 tablet (50 mg total) by mouth daily. (Patient not taking: Reported on 07/23/2017)   No facility-administered encounter medications on file as of 07/23/2017.      Objective: Blood pressure 110/80, pulse 67, temperature 97.6 F (36.4 C), temperature source Oral, resp. rate 18, height 5' 9"  (1.753 m), weight 231 lb 9.6 oz (105.1 kg). Patient is alert and in no acute distress. He does not have asterixis. Conjunctiva is pink. Sclera is nonicteric Oropharyngeal mucosa is normal. No neck masses or thyromegaly noted. Cardiac exam with regular rhythm normal S1 and S2. No murmur or gallop noted. Lungs are clear to auscultation. Abdomen is protuberant. On palpation is soft. No organomegaly or masses and shifting dullness is absent. No LE edema or clubbing noted.  Labs/studies Results: Serum ammonia 44 and 04/29/2017  Lab data from 04/02/2017 Serum sodium 136, potassium 4.5, chloride 99, CO2 26, BUN 15 and creatinine 1.04 Glucose 262.  Lab data from 02/13/2017. AFP 2.7 WBC 3.8, H&H 12.3 and 37.8 and platelet count 110 K. Bilirubin 0.9, AP 161, AST 44, ALT 34 and albumin 3.1.  Assessment:  #1. 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 He has not required abdominal tap in over 6 months which is reassuring but I'm very concerned about potential for progressive liver disease considering poorly controlled diabetes mellitus and weight gain which appears to be true weight gain and not fluid retention. Clinically he does not have ascites. He is due for screening for [place].  Patient's condition discussed with him and his wife at length. Either he doesn't not believe or does not  understand the severity of his liver disease and he must change his lifestyle and try to lose weight in order to improve his prognosis.    Plan:  Upper abdominal ultrasound. Patient will laugh for CBC, INR, comprehensive chemistry panel and AFP. Patient must quit drinking Coca-Cola or other carbonated drinks. He must walk or exercise every day. He can start slowly and gradually increase duration as tolerated. Office visit in 4 months.

## 2017-07-23 NOTE — Patient Instructions (Signed)
Physician will call with results of blood work and ultrasound when completed. You must increase physical activity gradually. Try to lose 15 pounds before your next office visit.

## 2017-07-24 LAB — COMPREHENSIVE METABOLIC PANEL
ALBUMIN: 3.4 g/dL — AB (ref 3.6–5.1)
ALT: 34 U/L (ref 9–46)
AST: 45 U/L — AB (ref 10–35)
Alkaline Phosphatase: 133 U/L — ABNORMAL HIGH (ref 40–115)
BILIRUBIN TOTAL: 1.5 mg/dL — AB (ref 0.2–1.2)
BUN: 13 mg/dL (ref 7–25)
CALCIUM: 9.5 mg/dL (ref 8.6–10.3)
CHLORIDE: 101 mmol/L (ref 98–110)
CO2: 24 mmol/L (ref 20–32)
Creat: 1.22 mg/dL — ABNORMAL HIGH (ref 0.70–1.18)
Glucose, Bld: 132 mg/dL — ABNORMAL HIGH (ref 65–99)
Potassium: 4.3 mmol/L (ref 3.5–5.3)
Sodium: 137 mmol/L (ref 135–146)
Total Protein: 6.5 g/dL (ref 6.1–8.1)

## 2017-07-24 LAB — PROTIME-INR
INR: 1.2 — ABNORMAL HIGH
PROTHROMBIN TIME: 12.4 s — AB (ref 9.0–11.5)

## 2017-07-24 LAB — AFP TUMOR MARKER: AFP-Tumor Marker: 2.3 ng/mL (ref <6.1)

## 2017-08-01 ENCOUNTER — Ambulatory Visit (HOSPITAL_COMMUNITY)
Admission: RE | Admit: 2017-08-01 | Discharge: 2017-08-01 | Disposition: A | Discharge status: Home / Self Care | Payer: Medicare Other | Source: Ambulatory Visit | Attending: Internal Medicine | Admitting: Internal Medicine

## 2017-08-01 DIAGNOSIS — K7581 Nonalcoholic steatohepatitis (NASH): Secondary | ICD-10-CM | POA: Diagnosis not present

## 2017-08-01 DIAGNOSIS — R188 Other ascites: Secondary | ICD-10-CM | POA: Diagnosis not present

## 2017-08-01 DIAGNOSIS — R161 Splenomegaly, not elsewhere classified: Secondary | ICD-10-CM | POA: Insufficient documentation

## 2017-08-01 DIAGNOSIS — K824 Cholesterolosis of gallbladder: Secondary | ICD-10-CM | POA: Insufficient documentation

## 2017-08-01 DIAGNOSIS — K7460 Unspecified cirrhosis of liver: Secondary | ICD-10-CM | POA: Insufficient documentation

## 2017-09-13 ENCOUNTER — Encounter (INDEPENDENT_AMBULATORY_CARE_PROVIDER_SITE_OTHER): Payer: Self-pay | Admitting: Internal Medicine

## 2017-09-26 ENCOUNTER — Encounter (INDEPENDENT_AMBULATORY_CARE_PROVIDER_SITE_OTHER): Payer: Self-pay | Admitting: Internal Medicine

## 2017-09-30 ENCOUNTER — Other Ambulatory Visit (INDEPENDENT_AMBULATORY_CARE_PROVIDER_SITE_OTHER): Payer: Self-pay | Admitting: *Deleted

## 2017-09-30 DIAGNOSIS — K7460 Unspecified cirrhosis of liver: Secondary | ICD-10-CM

## 2017-09-30 DIAGNOSIS — D696 Thrombocytopenia, unspecified: Secondary | ICD-10-CM

## 2017-09-30 DIAGNOSIS — R188 Other ascites: Secondary | ICD-10-CM

## 2017-09-30 DIAGNOSIS — D649 Anemia, unspecified: Secondary | ICD-10-CM

## 2017-10-02 ENCOUNTER — Ambulatory Visit (HOSPITAL_COMMUNITY)
Admission: RE | Admit: 2017-10-02 | Discharge: 2017-10-02 | Disposition: A | Discharge status: Home / Self Care | Payer: Medicare Other | Source: Ambulatory Visit | Attending: Internal Medicine | Admitting: Internal Medicine

## 2017-10-02 ENCOUNTER — Other Ambulatory Visit (INDEPENDENT_AMBULATORY_CARE_PROVIDER_SITE_OTHER): Payer: Self-pay | Admitting: Internal Medicine

## 2017-10-02 DIAGNOSIS — R188 Other ascites: Secondary | ICD-10-CM | POA: Insufficient documentation

## 2017-10-02 DIAGNOSIS — K7460 Unspecified cirrhosis of liver: Secondary | ICD-10-CM | POA: Insufficient documentation

## 2017-10-02 LAB — BODY FLUID CELL COUNT WITH DIFFERENTIAL
EOS FL: 0 %
Lymphs, Fluid: 56 %
MONOCYTE-MACROPHAGE-SEROUS FLUID: 44 % — AB (ref 50–90)
NEUTROPHIL FLUID: 0 % (ref 0–25)
OTHER CELLS FL: 0 %
WBC FLUID: 382 uL (ref 0–1000)

## 2017-10-02 LAB — GRAM STAIN

## 2017-10-02 NOTE — Procedures (Signed)
   US guided RLQ paracentesis  3.6 L yellow fluid Sent for labs per MD  Tolerated well

## 2017-10-03 ENCOUNTER — Ambulatory Visit (INDEPENDENT_AMBULATORY_CARE_PROVIDER_SITE_OTHER): Payer: Medicare Other | Admitting: Internal Medicine

## 2017-10-03 ENCOUNTER — Encounter (INDEPENDENT_AMBULATORY_CARE_PROVIDER_SITE_OTHER): Payer: Self-pay | Admitting: Internal Medicine

## 2017-10-03 VITALS — BP 122/66 | HR 70 | Temp 97.6°F | Resp 18 | Ht 69.0 in | Wt 254.4 lb

## 2017-10-03 DIAGNOSIS — K7290 Hepatic failure, unspecified without coma: Secondary | ICD-10-CM | POA: Diagnosis not present

## 2017-10-03 DIAGNOSIS — R188 Other ascites: Secondary | ICD-10-CM

## 2017-10-03 DIAGNOSIS — K7460 Unspecified cirrhosis of liver: Secondary | ICD-10-CM | POA: Diagnosis not present

## 2017-10-03 DIAGNOSIS — K7682 Hepatic encephalopathy: Secondary | ICD-10-CM

## 2017-10-03 DIAGNOSIS — K760 Fatty (change of) liver, not elsewhere classified: Secondary | ICD-10-CM

## 2017-10-03 LAB — PATHOLOGIST SMEAR REVIEW

## 2017-10-03 LAB — COMPREHENSIVE METABOLIC PANEL
AG Ratio: 1 (calc) (ref 1.0–2.5)
ALBUMIN MSPROF: 2.9 g/dL — AB (ref 3.6–5.1)
ALKALINE PHOSPHATASE (APISO): 132 U/L — AB (ref 40–115)
ALT: 31 U/L (ref 9–46)
AST: 50 U/L — ABNORMAL HIGH (ref 10–35)
BUN: 12 mg/dL (ref 7–25)
CALCIUM: 8.7 mg/dL (ref 8.6–10.3)
CO2: 30 mmol/L (ref 20–32)
CREATININE: 0.88 mg/dL (ref 0.70–1.18)
Chloride: 107 mmol/L (ref 98–110)
Globulin: 3 g/dL (calc) (ref 1.9–3.7)
Glucose, Bld: 135 mg/dL — ABNORMAL HIGH (ref 65–99)
POTASSIUM: 4.2 mmol/L (ref 3.5–5.3)
Sodium: 140 mmol/L (ref 135–146)
Total Bilirubin: 1.2 mg/dL (ref 0.2–1.2)
Total Protein: 5.9 g/dL — ABNORMAL LOW (ref 6.1–8.1)

## 2017-10-03 LAB — CBC
HCT: 33.3 % — ABNORMAL LOW (ref 38.5–50.0)
HEMOGLOBIN: 11.2 g/dL — AB (ref 13.2–17.1)
MCH: 32.1 pg (ref 27.0–33.0)
MCHC: 33.6 g/dL (ref 32.0–36.0)
MCV: 95.4 fL (ref 80.0–100.0)
MPV: 10.2 fL (ref 7.5–12.5)
Platelets: 92 10*3/uL — ABNORMAL LOW (ref 140–400)
RBC: 3.49 10*6/uL — AB (ref 4.20–5.80)
RDW: 11.9 % (ref 11.0–15.0)
WBC: 3.4 10*3/uL — AB (ref 3.8–10.8)

## 2017-10-03 LAB — PROTIME-INR
INR: 1.2 — AB
PROTHROMBIN TIME: 12.8 s — AB (ref 9.0–11.5)

## 2017-10-03 LAB — AMMONIA: AMMONIA: 85 umol/L — AB (ref <=72)

## 2017-10-03 MED ORDER — FUROSEMIDE 40 MG PO TABS: 40.0000 mg | ORAL_TABLET | Freq: Every day | ORAL | 2 refills | Status: DC

## 2017-10-03 MED ORDER — LACTULOSE 10 GM/15ML PO SOLN: 10.0000 g | Freq: Two times a day (BID) | ORAL | 0 refills | Status: DC

## 2017-10-03 MED ORDER — SPIRONOLACTONE 50 MG PO TABS: 100.0000 mg | ORAL_TABLET | Freq: Every day | ORAL | 5 refills | Status: DC

## 2017-10-03 NOTE — Patient Instructions (Addendum)
Metabolic 7 and serum ammonia in 2 weeks. Call if he has lost more than 10 pounds.

## 2017-10-03 NOTE — Progress Notes (Signed)
Presenting complaint;  Follow-up for chronic liver disease.  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 He required abdominal paracenteses twice in January 2018. He has been maintained on furosemide 40 mg 3 times a week and spironolactone 50 mg daily. He was last seen on 07/23/2017 when he weight 231 pounds. His wife called few days ago stating that he had gained over 30 pounds and his abdomen is distended. He was scheduled to undergo LVAP which was performed yesterday. 3.6 L of fluid was removed. He feels much better. He denies abdominal pain nausea vomiting fever or chills. He remains with good appetite. He also has been keeping track of his blood glucose levels. Most of his glucose levels range in mid 100 range. Except for one reading it has always been below 200. His wife states that he has been forgetful. Sometimes he will ask same question multiple times. However he has not been confused. He has 2-3 bowel movements daily. He denies melena or rectal bleeding.   Current Medications: Outpatient Encounter Prescriptions as of 10/03/2017  Medication Sig  . aspirin EC 81 MG tablet Take 81 mg by mouth daily.  Marland Kitchen atorvastatin (LIPITOR) 80 MG tablet Take 80 mg by mouth at bedtime.  . dicyclomine (BENTYL) 10 MG capsule TAKE 1 CAPSULE BY MOUTH THREE TIMES A DAY BEFORE MEALS  . furosemide (LASIX) 40 MG tablet Take 1 tablet (40 mg total) by mouth 3 (three) times a week.  . insulin glargine (LANTUS) 100 UNIT/ML injection Inject 40 Units into the skin at bedtime.   Marland Kitchen lisinopril (PRINIVIL,ZESTRIL) 5 MG tablet Take 5 mg by mouth daily.   . metFORMIN (GLUCOPHAGE) 1000 MG tablet Take 1,000 mg by mouth 2 (two) times daily with a meal.   . omeprazole (PRILOSEC) 40 MG capsule Take 40 mg by mouth every morning.   Marland Kitchen OVER THE COUNTER MEDICATION Alphabetic MVI - Patient states that he takes 1 by mouth every morning.  . sertraline  (ZOLOFT) 50 MG tablet Take 50 mg by mouth daily.   Marland Kitchen spironolactone (ALDACTONE) 50 MG tablet Take 50 mg by mouth daily.   . ferrous sulfate 325 (65 FE) MG tablet Take 325 mg by mouth 2 (two) times daily with a meal.  . [DISCONTINUED] METOPROLOL TARTRATE PO Take 25 mg by mouth 2 (two) times daily.   No facility-administered encounter medications on file as of 10/03/2017.      Objective: Blood pressure 122/66, pulse 70, temperature 97.6 F (36.4 C), temperature source Oral, resp. rate 18, height 5' 9"  (1.753 m), weight 254 lb 6.4 oz (115.4 kg). Patient is alert and in no acute distress. He does not have asterixis Conjunctiva is pink. Sclera is nonicteric Oropharyngeal mucosa is normal. No neck masses or thyromegaly noted. Cardiac exam with regular rhythm normal S1 and S2. No murmur or gallop noted. Lungs are clear to auscultation. Abdomen is protuberant. He has area of ecchymosis across lower abdomen and puncture site covered with Band-Aid in right midabdomen. Shifting dullness is absent. Liver spleen not palpable.  He has trace edema around ankles and 1+ pitting edema involving the dorsal aspect of feet.  Labs/studies Results:   Recent Labs  10/02/17 0950  WBC 3.4*  HGB 11.2*  HCT 33.3*  PLT 92*    BMET   Recent Labs  10/02/17 0950  NA 140  K 4.2  CL 107  CO2 30  GLUCOSE 135*  BUN 12  CREATININE  0.88  CALCIUM 8.7    LFT   Recent Labs  10/02/17 0950  PROT 5.9*  AST 50*  ALT 31  BILITOT 1.2    PT/INR   Recent Labs  10/02/17 0950  LABPROT 12.8*  INR 1.2*    Serum ammonia 85. It was 44 on 04/29/2017.  Ascitic fluid analysis  Clear yellow fluid. WBC 382. Neutrophils 0 lymphocytes 56% and monocytes/macrophages 44%. Gram stain negative. Culture negative at 24 hours.     Assessment:  #1. Cirrhotic ascites. Patient has gained over 30 pounds since he was last seen about 10 weeks ago. He had large volume abdominal paracenteses yesterday with removal  of 3.6 L of fluid. His weight 3 tap must have been close to 264 pounds. All of his weight gain is not due to fluid retention. It may be because of insulin use. He is on low-dose diuretic therapy and there is room to go up.  #2. Cirrhosis secondary to NASH. Serum albumin has dropped. MELD score is 9. He and 4 she is not a candidate for liver transplant given his age and comorbidities. Will check with transplant hepatologist anyway. He was last screened for varices in August 2017. He had moderate pH she bit no evidence of varices. Pancytopenia is felt to be secondary to chronic liver disease.  #4. Hepatic encephalopathy. He is having memory issues according to his wife but he has not been confused. Serum ammonia is elevated. He may benefit from low-dose lactulose.   Plan:  Increase furosemide to 40 mg every morning. Increase spironolactone 200 mg by mouth daily. Lactulose 10 g by mouth twice a day. He can titrate dose so that he has no more than 4 stools per day. Metabolic 7 and serum ammonia in 2 weeks. He also needs to talk with with Dr. Ginette Otto if he would benefit from dietary consultation so that he can lose some weight. Office visit in 6 weeks.

## 2017-10-04 ENCOUNTER — Other Ambulatory Visit (INDEPENDENT_AMBULATORY_CARE_PROVIDER_SITE_OTHER): Payer: Self-pay | Admitting: *Deleted

## 2017-10-04 ENCOUNTER — Encounter (INDEPENDENT_AMBULATORY_CARE_PROVIDER_SITE_OTHER): Payer: Self-pay | Admitting: *Deleted

## 2017-10-04 DIAGNOSIS — K7460 Unspecified cirrhosis of liver: Secondary | ICD-10-CM

## 2017-10-07 ENCOUNTER — Encounter (INDEPENDENT_AMBULATORY_CARE_PROVIDER_SITE_OTHER): Payer: Self-pay | Admitting: Internal Medicine

## 2017-10-07 ENCOUNTER — Telehealth (INDEPENDENT_AMBULATORY_CARE_PROVIDER_SITE_OTHER): Payer: Self-pay | Admitting: *Deleted

## 2017-10-07 LAB — CULTURE, BODY FLUID-BOTTLE: CULTURE: NO GROWTH

## 2017-10-07 MED ORDER — RIFAXIMIN 550 MG PO TABS: 550.0000 mg | ORAL_TABLET | Freq: Two times a day (BID) | ORAL | 5 refills | Status: DC

## 2017-10-07 NOTE — Telephone Encounter (Signed)
Patient's wife had sent a patient advice. She was asking questions. Patient will not be able to tolerate the Lactulose,even at a small dose. He has to go to the bathroom several 5-6 times daily. Per Dr.Rehman the patient may stop this and we may call in the Xifaxan 550 mg - take 1 by mouth twice a day. 1 month and 5 refills. This will be sent to the patient pharmacy.  For energy - Dr.Rehman recommended that he try Vitamin B. As for Prognosis , per Dr.Rehman - I cannot really say that, he could live several months or longer. This was shared with his wife.

## 2017-10-10 ENCOUNTER — Encounter (INDEPENDENT_AMBULATORY_CARE_PROVIDER_SITE_OTHER): Payer: Self-pay | Admitting: Internal Medicine

## 2017-10-19 LAB — BASIC METABOLIC PANEL
BUN: 11 mg/dL (ref 7–25)
CHLORIDE: 104 mmol/L (ref 98–110)
CO2: 30 mmol/L (ref 20–32)
Calcium: 8.7 mg/dL (ref 8.6–10.3)
Creat: 0.95 mg/dL (ref 0.70–1.18)
Glucose, Bld: 111 mg/dL (ref 65–139)
Potassium: 4 mmol/L (ref 3.5–5.3)
SODIUM: 139 mmol/L (ref 135–146)

## 2017-10-19 LAB — AMMONIA: Ammonia: 92 umol/L — ABNORMAL HIGH (ref <=72)

## 2017-10-31 ENCOUNTER — Encounter (INDEPENDENT_AMBULATORY_CARE_PROVIDER_SITE_OTHER): Payer: Self-pay | Admitting: Internal Medicine

## 2017-11-14 ENCOUNTER — Encounter (INDEPENDENT_AMBULATORY_CARE_PROVIDER_SITE_OTHER): Payer: Self-pay | Admitting: Internal Medicine

## 2017-11-14 ENCOUNTER — Ambulatory Visit (INDEPENDENT_AMBULATORY_CARE_PROVIDER_SITE_OTHER): Payer: Medicare Other | Admitting: Internal Medicine

## 2017-11-14 VITALS — BP 120/60 | HR 64 | Temp 98.1°F | Ht 69.0 in | Wt 235.0 lb

## 2017-11-14 DIAGNOSIS — R188 Other ascites: Secondary | ICD-10-CM | POA: Diagnosis not present

## 2017-11-14 DIAGNOSIS — K7460 Unspecified cirrhosis of liver: Secondary | ICD-10-CM

## 2017-11-14 NOTE — Patient Instructions (Signed)
OV in 4 months with Dr Laural Golden

## 2017-11-14 NOTE — Progress Notes (Signed)
Subjective:    Patient ID: Larry Hayes, male    DOB: 07/29/1946, 71 y.o.   MRN: [id number]  HPI Here today for f/u. Last seen by Dr. Laural Golden in August of this year. 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  Last paracentesis was in October of this year. Negative for SBP Also has thrombocytopenia.  He states he feels good.  Appetite is good. He has gained 4 pounds since August.  No periods of confusion.  He has a BM x 1 a day. No melena or BRRB.   CBC    Component Value Date/Time   WBC 3.4 (L) 10/02/2017 0950   RBC 3.49 (L) 10/02/2017 0950   HGB 11.2 (L) 10/02/2017 0950   HCT 33.3 (L) 10/02/2017 0950   PLT 92 (L) 10/02/2017 0950   MCV 95.4 10/02/2017 0950   MCH 32.1 10/02/2017 0950   MCHC 33.6 10/02/2017 0950   RDW 11.9 10/02/2017 0950   LYMPHSABS 828 (L) 01/03/2017 1150   MONOABS 460 01/03/2017 1150   EOSABS 92 01/03/2017 1150   BASOSABS 46 01/03/2017 1150      Hepatic Function Latest Ref Rng & Units 10/02/2017 07/23/2017 02/13/2017  Total Protein 6.1 - 8.1 g/dL 5.9(L) 6.5 6.7  Albumin 3.6 - 5.1 g/dL - 3.4(L) 3.1(L)  AST 10 - 35 U/L 50(H) 45(H) 44(H)  ALT 9 - 46 U/L 31 34 34  Alk Phosphatase 40 - 115 U/L - 133(H) 161(H)  Total Bilirubin 0.2 - 1.2 mg/dL 1.2 1.5(H) 0.9  Bilirubin, Direct <=0.2 mg/dL - - 0.3(H)   10/18/2017 92     Review of Systems Past Medical History:  Diagnosis Date  . Anemia   . Diabetes ([place])   . Hepatic cirrhosis ([place]) 08/21/2016  . High cholesterol   . Thrombocytopenia ([place]) 08/21/2016    Past Surgical History:  Procedure Laterality Date  . CATARACT EXTRACTION Right 11/2016  . ESOPHAGOGASTRODUODENOSCOPY N/A 08/01/2016   Procedure: ESOPHAGOGASTRODUODENOSCOPY (EGD);  Surgeon: Rogene Houston, MD;  Location: [facility];  Service: Endoscopy;  Laterality: N/A;  2:45  . HERNIA REPAIR     bilateral inguinal age 61  . KNEE SURGERY     rt arthroscopy    No Known Allergies  Current Outpatient Medications on File Prior to  Visit  Medication Sig Dispense Refill  . aspirin EC 81 MG tablet Take 81 mg by mouth daily.    Marland Kitchen atorvastatin (LIPITOR) 80 MG tablet Take 80 mg by mouth at bedtime.    . furosemide (LASIX) 40 MG tablet Take 1 tablet (40 mg total) by mouth daily. 30 tablet 2  . insulin glargine (LANTUS) 100 UNIT/ML injection Inject 40 Units into the skin at bedtime.     Marland Kitchen lactulose (CHRONULAC) 10 GM/15ML solution Take 15 mLs (10 g total) by mouth 2 (two) times daily. (Patient taking differently: Take 10 g by mouth daily. ) 946 mL 0  . lisinopril (PRINIVIL,ZESTRIL) 5 MG tablet Take 5 mg by mouth daily.     . metFORMIN (GLUCOPHAGE) 1000 MG tablet Take 1,000 mg by mouth 2 (two) times daily with a meal.     . omeprazole (PRILOSEC) 40 MG capsule Take 40 mg by mouth every morning.     Marland Kitchen OVER THE COUNTER MEDICATION Alphabetic MVI - Patient states that he takes 1 by mouth every morning.    . sertraline (ZOLOFT) 50 MG tablet Take 50 mg by mouth daily.     Marland Kitchen spironolactone (ALDACTONE) 50 MG tablet Take  2 tablets (100 mg total) by mouth daily. 60 tablet 5   No current facility-administered medications on file prior to visit.         Objective:   Physical Exam Blood pressure 120/60, pulse 64, temperature 98.1 F (36.7 C), height '5\' 9"'$  (1.753 m), weight 235 lb (106.6 kg). Alert and oriented. Skin warm and dry. Oral mucosa is moist.   . Sclera anicteric, conjunctivae is pink. Thyroid not enlarged. No cervical lymphadenopathy. Lungs clear. Heart regular rate and rhythm.  Abdomen is soft. Bowel sounds are positive. No hepatomegaly. No abdominal masses felt. No tenderness.  No edema to lower extremities.  No asterixis.          Assessment & Plan:  Cirrhosis. Will check an Ammonia level today since he has cut back on his Lactulose.   OV in 4 months with Dr. Laural Golden.

## 2017-11-15 LAB — AMMONIA: Ammonia: 77 umol/L — ABNORMAL HIGH (ref <=72)

## 2017-11-20 ENCOUNTER — Encounter (INDEPENDENT_AMBULATORY_CARE_PROVIDER_SITE_OTHER): Payer: Self-pay | Admitting: Internal Medicine

## 2017-11-20 ENCOUNTER — Other Ambulatory Visit (INDEPENDENT_AMBULATORY_CARE_PROVIDER_SITE_OTHER): Payer: Self-pay | Admitting: *Deleted

## 2017-11-20 DIAGNOSIS — K7460 Unspecified cirrhosis of liver: Secondary | ICD-10-CM

## 2017-11-20 NOTE — Progress Notes (Signed)
Per Delrae Renderri Setzer,NP the patient is to have lab work in 4 weeks , the patient will be sent a letter as a reminder.

## 2017-12-06 ENCOUNTER — Other Ambulatory Visit (INDEPENDENT_AMBULATORY_CARE_PROVIDER_SITE_OTHER): Payer: Self-pay | Admitting: *Deleted

## 2017-12-06 ENCOUNTER — Encounter (INDEPENDENT_AMBULATORY_CARE_PROVIDER_SITE_OTHER): Payer: Self-pay | Admitting: *Deleted

## 2017-12-06 DIAGNOSIS — K7460 Unspecified cirrhosis of liver: Secondary | ICD-10-CM

## 2017-12-19 ENCOUNTER — Telehealth (INDEPENDENT_AMBULATORY_CARE_PROVIDER_SITE_OTHER): Payer: Self-pay | Admitting: Internal Medicine

## 2017-12-19 DIAGNOSIS — R188 Other ascites: Secondary | ICD-10-CM

## 2017-12-19 NOTE — Telephone Encounter (Signed)
Patient presented to office requesting to have fluid drawn off his belly.  Patient is having discomfort.  [phone]

## 2017-12-19 NOTE — Telephone Encounter (Signed)
Larry Hayes, US paracentesis 

## 2017-12-19 NOTE — Telephone Encounter (Signed)
ordered

## 2017-12-19 NOTE — Telephone Encounter (Signed)
US paracentesis sch'd 12/20/17 at 9:00 (845), patient aware

## 2017-12-20 ENCOUNTER — Other Ambulatory Visit (INDEPENDENT_AMBULATORY_CARE_PROVIDER_SITE_OTHER): Payer: Self-pay | Admitting: Internal Medicine

## 2017-12-20 ENCOUNTER — Ambulatory Visit (HOSPITAL_COMMUNITY)
Admission: RE | Admit: 2017-12-20 | Discharge: 2017-12-20 | Disposition: A | Discharge status: Home / Self Care | Payer: Medicare Other | Source: Ambulatory Visit | Attending: Internal Medicine | Admitting: Internal Medicine

## 2017-12-20 DIAGNOSIS — R188 Other ascites: Secondary | ICD-10-CM | POA: Diagnosis not present

## 2017-12-20 LAB — AMMONIA: AMMONIA: 142 umol/L — AB (ref <=72)

## 2017-12-23 ENCOUNTER — Other Ambulatory Visit (INDEPENDENT_AMBULATORY_CARE_PROVIDER_SITE_OTHER): Payer: Self-pay | Admitting: *Deleted

## 2017-12-23 ENCOUNTER — Encounter (INDEPENDENT_AMBULATORY_CARE_PROVIDER_SITE_OTHER): Payer: Self-pay | Admitting: Internal Medicine

## 2017-12-23 ENCOUNTER — Encounter (INDEPENDENT_AMBULATORY_CARE_PROVIDER_SITE_OTHER): Payer: Self-pay | Admitting: *Deleted

## 2017-12-23 DIAGNOSIS — K7460 Unspecified cirrhosis of liver: Secondary | ICD-10-CM

## 2018-01-04 LAB — AMMONIA: Ammonia: 78 umol/L — ABNORMAL HIGH (ref <=72)

## 2018-01-08 ENCOUNTER — Other Ambulatory Visit (INDEPENDENT_AMBULATORY_CARE_PROVIDER_SITE_OTHER): Payer: Self-pay | Admitting: *Deleted

## 2018-01-08 ENCOUNTER — Encounter (INDEPENDENT_AMBULATORY_CARE_PROVIDER_SITE_OTHER): Payer: Self-pay | Admitting: *Deleted

## 2018-01-08 DIAGNOSIS — K7460 Unspecified cirrhosis of liver: Secondary | ICD-10-CM

## 2018-01-08 DIAGNOSIS — R188 Other ascites: Principal | ICD-10-CM

## 2018-01-27 ENCOUNTER — Encounter (INDEPENDENT_AMBULATORY_CARE_PROVIDER_SITE_OTHER): Payer: Self-pay | Admitting: *Deleted

## 2018-01-27 ENCOUNTER — Telehealth (INDEPENDENT_AMBULATORY_CARE_PROVIDER_SITE_OTHER): Payer: Self-pay | Admitting: *Deleted

## 2018-01-27 NOTE — Telephone Encounter (Signed)
Patient's wife called and ask if husband should be seen sooner that April. She states that he has gotten mean  , sets in chair all the time , eats onlt at lunch time. She says that he looks like he has lost weight. She is not sure if he is taking the Lactulose 3 times daily or not.   Per Dr.Rehman patient is try Xifaxan 550 mg twice a day for 1 month. On the second or third day the patient will need to have his ammonia level drawn.  Patient advice sent to the patient's wife.

## 2018-01-29 ENCOUNTER — Telehealth (INDEPENDENT_AMBULATORY_CARE_PROVIDER_SITE_OTHER): Payer: Self-pay | Admitting: *Deleted

## 2018-01-29 ENCOUNTER — Telehealth (INDEPENDENT_AMBULATORY_CARE_PROVIDER_SITE_OTHER): Payer: Self-pay | Admitting: Internal Medicine

## 2018-01-29 DIAGNOSIS — R188 Other ascites: Secondary | ICD-10-CM

## 2018-01-29 NOTE — Telephone Encounter (Signed)
Patient called during lunch left message requesting for fluid to be drawn off. Please advise [phone] or [phone]

## 2018-01-29 NOTE — Telephone Encounter (Signed)
US paracentesis sch'd 01/30/18 at 1100 (1045), patient aware 

## 2018-01-29 NOTE — Telephone Encounter (Signed)
US paracentesis ordered 

## 2018-01-29 NOTE — Telephone Encounter (Signed)
US paracentesis sch'd 01/30/18 at 1100 (1045), patient aware

## 2018-01-29 NOTE — Telephone Encounter (Signed)
I have ordered US paracentesis

## 2018-01-30 ENCOUNTER — Ambulatory Visit (HOSPITAL_COMMUNITY)
Admission: RE | Admit: 2018-01-30 | Discharge: 2018-01-30 | Disposition: A | Discharge status: Home / Self Care | Payer: Medicare Other | Source: Ambulatory Visit | Attending: Internal Medicine | Admitting: Internal Medicine

## 2018-01-30 ENCOUNTER — Other Ambulatory Visit (INDEPENDENT_AMBULATORY_CARE_PROVIDER_SITE_OTHER): Payer: Self-pay | Admitting: Internal Medicine

## 2018-01-30 DIAGNOSIS — R188 Other ascites: Secondary | ICD-10-CM | POA: Diagnosis present

## 2018-01-30 NOTE — Progress Notes (Signed)
Patient ID: Larry Hayes, male   DOB: 1946/09/22, 72 y.o.   MRN: [id number]   Limited US Abd performed Very minimal ascites seen  No paracentesis performed

## 2018-02-03 ENCOUNTER — Encounter (INDEPENDENT_AMBULATORY_CARE_PROVIDER_SITE_OTHER): Payer: Self-pay | Admitting: Internal Medicine

## 2018-02-04 ENCOUNTER — Encounter (INDEPENDENT_AMBULATORY_CARE_PROVIDER_SITE_OTHER): Payer: Self-pay | Admitting: *Deleted

## 2018-02-06 ENCOUNTER — Other Ambulatory Visit (INDEPENDENT_AMBULATORY_CARE_PROVIDER_SITE_OTHER): Payer: Self-pay | Admitting: *Deleted

## 2018-02-06 ENCOUNTER — Encounter (INDEPENDENT_AMBULATORY_CARE_PROVIDER_SITE_OTHER): Payer: Self-pay | Admitting: *Deleted

## 2018-02-06 DIAGNOSIS — R188 Other ascites: Principal | ICD-10-CM

## 2018-02-06 DIAGNOSIS — K7460 Unspecified cirrhosis of liver: Secondary | ICD-10-CM

## 2018-02-06 LAB — AMMONIA: AMMONIA: 73 umol/L — AB (ref <=72)

## 2018-02-07 ENCOUNTER — Telehealth (INDEPENDENT_AMBULATORY_CARE_PROVIDER_SITE_OTHER): Payer: Self-pay | Admitting: *Deleted

## 2018-02-07 NOTE — Telephone Encounter (Signed)
Dr. Karilyn Cotaehman, just to let you know Sherilyn CooterHenry did not pick up the medicine you called in. His co-pay would have been $380.00 dollars which he could not afford. We thank you for your effort. Sincerely, Ardeth PerfectBrenda Hults

## 2018-02-10 NOTE — Telephone Encounter (Signed)
Patient called and message left on her answering service. 44[place] of Xifaxan 150 [place] Also trying to get samples for her husband. She can call office and let [place].

## 2018-02-10 NOTE — Telephone Encounter (Signed)
Patient was called and given his ammonia level and his wife confirmed that she had rec'd the message that Dr.Rehman had left for him regarding medication.

## 2018-02-13 ENCOUNTER — Telehealth (INDEPENDENT_AMBULATORY_CARE_PROVIDER_SITE_OTHER): Payer: Self-pay | Admitting: *Deleted

## 2018-02-13 NOTE — Telephone Encounter (Signed)
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 Ascites.

## 2018-02-17 ENCOUNTER — Encounter (INDEPENDENT_AMBULATORY_CARE_PROVIDER_SITE_OTHER): Payer: Self-pay | Admitting: *Deleted

## 2018-02-17 ENCOUNTER — Other Ambulatory Visit (INDEPENDENT_AMBULATORY_CARE_PROVIDER_SITE_OTHER): Payer: Self-pay | Admitting: Internal Medicine

## 2018-02-17 ENCOUNTER — Encounter (INDEPENDENT_AMBULATORY_CARE_PROVIDER_SITE_OTHER): Payer: Self-pay | Admitting: Internal Medicine

## 2018-02-17 DIAGNOSIS — R188 Other ascites: Principal | ICD-10-CM

## 2018-02-17 DIAGNOSIS — K7460 Unspecified cirrhosis of liver: Secondary | ICD-10-CM

## 2018-02-17 NOTE — Telephone Encounter (Signed)
Scheduled for 3/14 at 9:30 NPO after midnight --[facility]15 [facility]

## 2018-02-18 ENCOUNTER — Other Ambulatory Visit (INDEPENDENT_AMBULATORY_CARE_PROVIDER_SITE_OTHER): Payer: Self-pay | Admitting: Internal Medicine

## 2018-02-18 NOTE — Telephone Encounter (Signed)
I rec'da patient advice that they had rec'd the message about the ultra sound.

## 2018-02-20 ENCOUNTER — Ambulatory Visit (HOSPITAL_COMMUNITY)
Admission: RE | Admit: 2018-02-20 | Discharge: 2018-02-20 | Disposition: A | Discharge status: Home / Self Care | Payer: Medicare Other | Source: Ambulatory Visit | Attending: Internal Medicine | Admitting: Internal Medicine

## 2018-02-20 ENCOUNTER — Other Ambulatory Visit (INDEPENDENT_AMBULATORY_CARE_PROVIDER_SITE_OTHER): Payer: Self-pay | Admitting: *Deleted

## 2018-02-20 DIAGNOSIS — K7460 Unspecified cirrhosis of liver: Secondary | ICD-10-CM | POA: Insufficient documentation

## 2018-02-20 DIAGNOSIS — R161 Splenomegaly, not elsewhere classified: Secondary | ICD-10-CM | POA: Insufficient documentation

## 2018-02-20 DIAGNOSIS — R188 Other ascites: Secondary | ICD-10-CM | POA: Insufficient documentation

## 2018-02-20 MED ORDER — LACTULOSE 10 GM/15ML PO SOLN: 10.0000 g | Freq: Two times a day (BID) | ORAL | 11 refills | Status: DC

## 2018-03-05 ENCOUNTER — Other Ambulatory Visit (INDEPENDENT_AMBULATORY_CARE_PROVIDER_SITE_OTHER): Payer: Self-pay | Admitting: *Deleted

## 2018-03-05 ENCOUNTER — Encounter (INDEPENDENT_AMBULATORY_CARE_PROVIDER_SITE_OTHER): Payer: Self-pay | Admitting: Internal Medicine

## 2018-03-05 DIAGNOSIS — K7460 Unspecified cirrhosis of liver: Secondary | ICD-10-CM

## 2018-03-05 DIAGNOSIS — Z79899 Other long term (current) drug therapy: Secondary | ICD-10-CM

## 2018-03-07 LAB — AMMONIA: AMMONIA: 80 umol/L — AB (ref <=72)

## 2018-04-01 ENCOUNTER — Encounter (INDEPENDENT_AMBULATORY_CARE_PROVIDER_SITE_OTHER): Payer: Self-pay | Admitting: Internal Medicine

## 2018-04-01 ENCOUNTER — Ambulatory Visit (INDEPENDENT_AMBULATORY_CARE_PROVIDER_SITE_OTHER): Payer: Medicare Other | Admitting: Internal Medicine

## 2018-04-01 ENCOUNTER — Encounter (INDEPENDENT_AMBULATORY_CARE_PROVIDER_SITE_OTHER): Payer: Self-pay | Admitting: *Deleted

## 2018-04-01 ENCOUNTER — Other Ambulatory Visit (INDEPENDENT_AMBULATORY_CARE_PROVIDER_SITE_OTHER): Payer: Self-pay | Admitting: Internal Medicine

## 2018-04-01 VITALS — BP 120/58 | HR 67 | Temp 97.2°F | Resp 18 | Ht 69.0 in | Wt 245.7 lb

## 2018-04-01 DIAGNOSIS — R188 Other ascites: Secondary | ICD-10-CM

## 2018-04-01 DIAGNOSIS — K219 Gastro-esophageal reflux disease without esophagitis: Secondary | ICD-10-CM

## 2018-04-01 DIAGNOSIS — K7290 Hepatic failure, unspecified without coma: Secondary | ICD-10-CM | POA: Diagnosis not present

## 2018-04-01 DIAGNOSIS — K7460 Unspecified cirrhosis of liver: Secondary | ICD-10-CM | POA: Diagnosis not present

## 2018-04-01 DIAGNOSIS — K7682 Hepatic encephalopathy: Secondary | ICD-10-CM

## 2018-04-01 DIAGNOSIS — K7581 Nonalcoholic steatohepatitis (NASH): Secondary | ICD-10-CM

## 2018-04-01 DIAGNOSIS — K7469 Other cirrhosis of liver: Secondary | ICD-10-CM | POA: Insufficient documentation

## 2018-04-01 MED ORDER — OMEPRAZOLE 20 MG PO CPDR: 20.0000 mg | DELAYED_RELEASE_CAPSULE | Freq: Every day | ORAL | 3 refills | Status: AC

## 2018-04-01 MED ORDER — LACTULOSE 10 GM/15ML PO SOLN: 10.0000 g | Freq: Two times a day (BID) | ORAL | 11 refills | Status: DC

## 2018-04-01 NOTE — Patient Instructions (Signed)
Esophagogastroduodenoscopy to be scheduled. Physician will call with results of blood test when completed.

## 2018-04-01 NOTE — Progress Notes (Signed)
Presenting complaint;  Follow-up for chronic liver disease.  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 Patient has no complaints.  His wife states some days he has 10 stools per day.  Most of his stools are small volume.  He denies abdominal pain melena or rectal bleeding.  Now that weather has changed he is getting out of the house more often.  His wife states he recently wanted to go to [place] to play and softball tournament but she advised him not to do so. He is having intermittent confusion spells.  Patient states he does get tired quickly but when he rests for a few minutes he feels better again.  He has gained 10 pounds since his last visit.  He has gained 25 pounds since April last year.  His wife states that he eats 1 meal every day and then snacks at other times. He needs furosemide prescription refills.  His [place]ifaxan [place] but she has not received prescription yet.   Current Medications: Outpatient Encounter Medications as of 04/01/2018  Medication Sig  . aspirin EC 81 MG tablet Take 81 mg by mouth daily.  . furosemide (LASIX) 40 MG tablet Take 1 tablet (40 mg total) by mouth daily.  . insulin glargine (LANTUS) 100 UNIT/ML injection Inject 40 Units into the skin at bedtime.   Marland Kitchen lactulose (CHRONULAC) 10 GM/15ML solution Take 15 mLs (10 g total) by mouth 2 (two) times daily. (Patient taking differently: Take 10 g by mouth 3 (three) times daily. )  . lisinopril (PRINIVIL,ZESTRIL) 5 MG tablet Take 5 mg by mouth daily.   . metFORMIN (GLUCOPHAGE) 1000 MG tablet Take 1,000 mg by mouth 2 (two) times daily with a meal.   . omeprazole (PRILOSEC) 40 MG capsule Take 40 mg by mouth every morning.   Marland Kitchen OVER THE COUNTER MEDICATION Alphabetic MVI - Patient states that he takes 1 by mouth every  morning.  . sertraline (ZOLOFT) 50 MG tablet Take 50 mg by mouth daily.   Marland Kitchen atorvastatin (LIPITOR) 80 MG tablet Take 80 mg by mouth at bedtime.  Marland Kitchen spironolactone (ALDACTONE) 50 MG tablet Take 2 tablets (100 mg total) by mouth daily. (Patient not taking: Reported on 04/01/2018)   No facility-administered encounter medications on file as of 04/01/2018.      Objective: Blood pressure (!) 120/58, pulse 67, temperature (!) 97.2 F (36.2 C), temperature source Oral, resp. rate 18, height 5' 9"  (1.753 m), weight 245 lb 11.2 oz (111.4 kg). Patient is alert and in no acute distress. He does not have asterixis. Conjunctiva is pink. Sclera is nonicteric Oropharyngeal mucosa is normal. No neck masses or thyromegaly noted. Cardiac exam with regular rhythm normal S1 and S2. No murmur or gallop noted. Lungs are clear to auscultation. Abdomen is full.  On palpation it is soft and nontender.  Liver and spleen could not be palpated. No LE edema or clubbing noted.  Labs/studies Results:  Ultrasound from 02/20/2018 reveals echogenic liver.  Irregular contour consistent with cirrhosis.  Splenomegaly and minimal ascites.  Serum ammonia was 80 on 03/06/2018.  Assessment:  #1.  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  He had minimal ascites on recent ultrasound.  On exam today he does not have ascites.  Last abdominal tap was 6 months ago.  Therefore it appears his  hepatic function has stabilized.  However weight gain is very some. He was last screened for varices in 2016.  He is overdue for screening and therapy for esophageal varices. Hepatic encephalopathy is not well controlled with dietary measures and lactulose.  Hopefully things will improve when he start Xifaxan.   #2.  GERD.  He is doing well with therapy.  I believe he would do fine with PPI dose reduction.   Plan:  Decrease omeprazole to 20 mg p.o. every morning. Decrease lactulose to 15 mL p.o. twice  daily.  Dose can be titrated.  Goal is 3-4 bowel movements per day. He will go to the lab for CBC, comprehensive chemistry panel, INR and AFP. We will schedule patient for EGD under monitored anesthesia care near future. Patient encouraged to do regular walking or exercise as tolerated. Office visit in 3 months.

## 2018-04-02 NOTE — Patient Instructions (Signed)
Larry Hayes  04/02/2018     @PREFPERIOPPHARMACY @   Your procedure is scheduled on  04/07/2018 .  [facility] to [facility] at  [facility]   A.M.  Call this number if you have problems the morning of surgery:  [phone]   Remember:  Do not eat food or drink liquids after midnight.  Take these medicines the morning of surgery with A SIP OF WATER  Prilosec, zoloft.   Do not wear jewelry, make-up or nail polish.  Do not wear lotions, powders, or perfumes, or deodorant.  Do not shave 48 hours prior to surgery.  Men may shave face and neck.  Do not bring valuables to the hospital.  [facility] is not responsible for any belongings or valuables.  Contacts, dentures or bridgework may not be worn into surgery.  Leave your suitcase in the car.  After surgery it may be brought to your room.  For patients admitted to the hospital, discharge time will be determined by your treatment team.  Patients discharged the day of surgery will not be allowed to drive home.   Name and phone number of your driver:   family Special instructions:  Follow the diet instructions given to you by Dr Patty Sermonsehman's office.  Please read over the following fact sheets that you were given. Anesthesia Post-op Instructions and Care and Recovery After Surgery       Esophagogastroduodenoscopy Esophagogastroduodenoscopy (EGD) is a procedure to examine the lining of the esophagus, stomach, and first part of the small intestine (duodenum). This procedure is done to check for problems such as inflammation, bleeding, ulcers, or growths. During this procedure, a long, flexible, lighted tube with a camera attached (endoscope) is inserted down the throat. Tell a health care provider about:  Any allergies you have.  All medicines you are taking, including vitamins, herbs, eye drops, creams, and over-the-counter medicines.  Any problems you or family members have had with anesthetic medicines.  Any blood disorders  you have.  Any surgeries you have had.  Any medical conditions you have.  Whether you are pregnant or may be pregnant. What are the risks? Generally, this is a safe procedure. However, problems may occur, including:  Infection.  Bleeding.  A tear (perforation) in the esophagus, stomach, or duodenum.  Trouble breathing.  Excessive sweating.  Spasms of the larynx.  A slowed heartbeat.  Low blood pressure.  What happens before the procedure?  Follow instructions from your health care provider about eating or drinking restrictions.  Ask your health care provider about: ? Changing or stopping your regular medicines. This is especially important if you are taking diabetes medicines or blood thinners. ? Taking medicines such as aspirin and ibuprofen. These medicines can thin your blood. Do not take these medicines before your procedure if your health care provider instructs you not to.  Plan to have someone take you home after the procedure.  If you wear dentures, be ready to remove them before the procedure. What happens during the procedure?  To reduce your risk of infection, your health care team will wash or sanitize their hands.  An IV tube will be put in a vein in your hand or arm. You will get medicines and fluids through this tube.  You will be given one or more of the following: ? A medicine to help you relax (sedative). ? A medicine to numb the area (local anesthetic). This medicine may be sprayed into your  throat. It will make you feel more comfortable and keep you from gagging or coughing during the procedure. ? A medicine for pain.  A mouth guard may be placed in your mouth to protect your teeth and to keep you from biting on the endoscope.  You will be asked to lie on your left side.  The endoscope will be lowered down your throat into your esophagus, stomach, and duodenum.  Air will be put into the endoscope. This will help your health care provider see  better.  The lining of your esophagus, stomach, and duodenum will be examined.  Your health care provider may: ? Take a tissue sample so it can be looked at in a lab (biopsy). ? Remove growths. ? Remove objects (foreign bodies) that are stuck. ? Treat any bleeding with medicines or other devices that stop tissue from bleeding. ? Widen (dilate) or stretch narrowed areas of your esophagus and stomach.  The endoscope will be taken out. The procedure may vary among health care providers and hospitals. What happens after the procedure?  Your blood pressure, heart rate, breathing rate, and blood oxygen level will be monitored often until the medicines you were given have worn off.  Do not eat or drink anything until the numbing medicine has worn off and your gag reflex has returned. This information is not intended to replace advice given to you by your health care provider. Make sure you discuss any questions you have with your health care provider. Document Released: 03/29/2005 Document Revised: 05/03/2016 Document Reviewed: 10/20/2015 Elsevier Interactive Patient Education  2018 [employer]. Esophagogastroduodenoscopy, Care After Refer to this sheet in the next few weeks. These instructions provide you with information about caring for yourself after your procedure. Your health care provider may also give you more specific instructions. Your treatment has been planned according to current medical practices, but problems sometimes occur. Call your health care provider if you have any problems or questions after your procedure. What can I expect after the procedure? After the procedure, it is common to have:  A sore throat.  Nausea.  Bloating.  Dizziness.  Fatigue.  Follow these instructions at home:  Do not eat or drink anything until the numbing medicine (local anesthetic) has worn off and your gag reflex has returned. You will know that the local anesthetic has worn off when you  can swallow comfortably.  Do not drive for 24 hours if you received a medicine to help you relax (sedative).  If your health care provider took a tissue sample for testing during the procedure, make sure to get your test results. This is your responsibility. Ask your health care provider or the department performing the test when your results will be ready.  Keep all follow-up visits as told by your health care provider. This is important. Contact a health care provider if:  You cannot stop coughing.  You are not urinating.  You are urinating less than usual. Get help right away if:  You have trouble swallowing.  You cannot eat or drink.  You have throat or chest pain that gets worse.  You are dizzy or light-headed.  You faint.  You have nausea or vomiting.  You have chills.  You have a fever.  You have severe abdominal pain.  You have black, tarry, or bloody stools. This information is not intended to replace advice given to you by your health care provider. Make sure you discuss any questions you have with your health care provider.  Document Released: 11/12/2012 Document Revised: 05/03/2016 Document Reviewed: 10/20/2015 Elsevier Interactive Patient Education  2018 [place] Anesthesia is a term that refers to techniques, procedures, and medicines that help a person stay safe and comfortable during a medical procedure. Monitored anesthesia care, or sedation, is one type of anesthesia. Your anesthesia specialist may recommend sedation if you will be having a procedure that does not require you to be unconscious, such as:  Cataract surgery.  A dental procedure.  A biopsy.  A colonoscopy.  During the procedure, you may receive a medicine to help you relax (sedative). There are three levels of sedation:  Mild sedation. At this level, you may feel awake and relaxed. You will be able to follow directions.  Moderate sedation. At this  level, you will be sleepy. You may not remember the procedure.  Deep sedation. At this level, you will be asleep. You will not remember the procedure.  The more medicine you are given, the deeper your level of sedation will be. Depending on how you respond to the procedure, the anesthesia specialist may change your level of sedation or the type of anesthesia to fit your needs. An anesthesia specialist will monitor you closely during the procedure. Let your health care provider know about:  Any allergies you have.  All medicines you are taking, including vitamins, herbs, eye drops, creams, and over-the-counter medicines.  Any use of steroids (by mouth or as a cream).  Any problems you or family members have had with sedatives and anesthetic medicines.  Any blood disorders you have.  Any surgeries you have had.  Any medical conditions you have, such as sleep apnea.  Whether you are pregnant or may be pregnant.  Any use of cigarettes, alcohol, or street drugs. What are the risks? Generally, this is a safe procedure. However, problems may occur, including:  Getting too much medicine (oversedation).  Nausea.  Allergic reaction to medicines.  Trouble breathing. If this happens, a breathing tube may be used to help with breathing. It will be removed when you are awake and breathing on your own.  Heart trouble.  Lung trouble.  Before the procedure Staying hydrated Follow instructions from your health care provider about hydration, which may include:  Up to 2 hours before the procedure - you may continue to drink clear liquids, such as water, clear fruit juice, black coffee, and plain tea.  Eating and drinking restrictions Follow instructions from your health care provider about eating and drinking, which may include:  8 hours before the procedure - stop eating heavy meals or foods such as meat, fried foods, or fatty foods.  6 hours before the procedure - stop eating light  meals or foods, such as toast or cereal.  6 hours before the procedure - stop drinking milk or drinks that contain milk.  2 hours before the procedure - stop drinking clear liquids.  Medicines Ask your health care provider about:  Changing or stopping your regular medicines. This is especially important if you are taking diabetes medicines or blood thinners.  Taking medicines such as aspirin and ibuprofen. These medicines can thin your blood. Do not take these medicines before your procedure if your health care provider instructs you not to.  Tests and exams  You will have a physical exam.  You may have blood tests done to show: ? How well your kidneys and liver are working. ? How well your blood can clot.  General instructions  Plan to have  someone take you home from the hospital or clinic.  If you will be going home right after the procedure, plan to have someone with you for 24 hours.  What happens during the procedure?  Your blood pressure, heart rate, breathing, level of pain and overall condition will be monitored.  An IV tube will be inserted into one of your veins.  Your anesthesia specialist will give you medicines as needed to keep you comfortable during the procedure. This may mean changing the level of sedation.  The procedure will be performed. After the procedure  Your blood pressure, heart rate, breathing rate, and blood oxygen level will be monitored until the medicines you were given have worn off.  Do not drive for 24 hours if you received a sedative.  You may: ? Feel sleepy, clumsy, or nauseous. ? Feel forgetful about what happened after the procedure. ? Have a sore throat if you had a breathing tube during the procedure. ? Vomit. This information is not intended to replace advice given to you by your health care provider. Make sure you discuss any questions you have with your health care provider. Document Released: 08/22/2005 Document Revised:  05/04/2016 Document Reviewed: 03/18/2016 Elsevier Interactive Patient Education  2018 [place], Care After These instructions provide you with information about caring for yourself after your procedure. Your health care provider may also give you more specific instructions. Your treatment has been planned according to current medical practices, but problems sometimes occur. Call your health care provider if you have any problems or questions after your procedure. What can I expect after the procedure? After your procedure, it is common to:  Feel sleepy for several hours.  Feel clumsy and have poor balance for several hours.  Feel forgetful about what happened after the procedure.  Have poor judgment for several hours.  Feel nauseous or vomit.  Have a sore throat if you had a breathing tube during the procedure.  Follow these instructions at home: For at least 24 hours after the procedure:   Do not: ? Participate in activities in which you could fall or become injured. ? Drive. ? Use heavy machinery. ? Drink alcohol. ? Take sleeping pills or medicines that cause drowsiness. ? Make important decisions or sign legal documents. ? Take care of children on your own.  Rest. Eating and drinking  Follow the diet that is recommended by your health care provider.  If you vomit, drink water, juice, or soup when you can drink without vomiting.  Make sure you have little or no nausea before eating solid foods. General instructions  Have a responsible adult stay with you until you are awake and alert.  Take over-the-counter and prescription medicines only as told by your health care provider.  If you smoke, do not smoke without supervision.  Keep all follow-up visits as told by your health care provider. This is important. Contact a health care provider if:  You keep feeling nauseous or you keep vomiting.  You feel light-headed.  You develop a  rash.  You have a fever. Get help right away if:  You have trouble breathing. This information is not intended to replace advice given to you by your health care provider. Make sure you discuss any questions you have with your health care provider. Document Released: 03/18/2016 Document Revised: 07/18/2016 Document Reviewed: 03/18/2016 Elsevier Interactive Patient Education  Angelus Schein.

## 2018-04-03 ENCOUNTER — Encounter (INDEPENDENT_AMBULATORY_CARE_PROVIDER_SITE_OTHER): Payer: Self-pay | Admitting: *Deleted

## 2018-04-03 ENCOUNTER — Other Ambulatory Visit (INDEPENDENT_AMBULATORY_CARE_PROVIDER_SITE_OTHER): Payer: Self-pay | Admitting: *Deleted

## 2018-04-03 DIAGNOSIS — Z79899 Other long term (current) drug therapy: Secondary | ICD-10-CM

## 2018-04-03 DIAGNOSIS — K7460 Unspecified cirrhosis of liver: Secondary | ICD-10-CM

## 2018-04-04 ENCOUNTER — Other Ambulatory Visit: Payer: Self-pay

## 2018-04-04 ENCOUNTER — Encounter (INDEPENDENT_AMBULATORY_CARE_PROVIDER_SITE_OTHER): Payer: Self-pay | Admitting: Internal Medicine

## 2018-04-04 ENCOUNTER — Telehealth (INDEPENDENT_AMBULATORY_CARE_PROVIDER_SITE_OTHER): Payer: Self-pay | Admitting: *Deleted

## 2018-04-04 ENCOUNTER — Encounter (HOSPITAL_COMMUNITY)
Admission: RE | Admit: 2018-04-04 | Discharge: 2018-04-04 | Disposition: A | Discharge status: Home / Self Care | Payer: Medicare Other | Source: Ambulatory Visit | Attending: Internal Medicine | Admitting: Internal Medicine

## 2018-04-04 ENCOUNTER — Encounter (HOSPITAL_COMMUNITY): Payer: Self-pay

## 2018-04-04 DIAGNOSIS — K7460 Unspecified cirrhosis of liver: Secondary | ICD-10-CM | POA: Diagnosis not present

## 2018-04-04 DIAGNOSIS — Z7982 Long term (current) use of aspirin: Secondary | ICD-10-CM | POA: Diagnosis not present

## 2018-04-04 DIAGNOSIS — K7290 Hepatic failure, unspecified without coma: Secondary | ICD-10-CM | POA: Diagnosis not present

## 2018-04-04 DIAGNOSIS — Z87891 Personal history of nicotine dependence: Secondary | ICD-10-CM | POA: Diagnosis not present

## 2018-04-04 DIAGNOSIS — M1990 Unspecified osteoarthritis, unspecified site: Secondary | ICD-10-CM | POA: Diagnosis not present

## 2018-04-04 DIAGNOSIS — I10 Essential (primary) hypertension: Secondary | ICD-10-CM | POA: Diagnosis not present

## 2018-04-04 DIAGNOSIS — E119 Type 2 diabetes mellitus without complications: Secondary | ICD-10-CM | POA: Diagnosis not present

## 2018-04-04 DIAGNOSIS — G4730 Sleep apnea, unspecified: Secondary | ICD-10-CM | POA: Diagnosis not present

## 2018-04-04 DIAGNOSIS — K3189 Other diseases of stomach and duodenum: Secondary | ICD-10-CM | POA: Diagnosis not present

## 2018-04-04 DIAGNOSIS — Z1389 Encounter for screening for other disorder: Secondary | ICD-10-CM | POA: Diagnosis not present

## 2018-04-04 DIAGNOSIS — Z79899 Other long term (current) drug therapy: Secondary | ICD-10-CM | POA: Diagnosis not present

## 2018-04-04 DIAGNOSIS — K7469 Other cirrhosis of liver: Secondary | ICD-10-CM

## 2018-04-04 DIAGNOSIS — K766 Portal hypertension: Secondary | ICD-10-CM | POA: Diagnosis not present

## 2018-04-04 DIAGNOSIS — K7581 Nonalcoholic steatohepatitis (NASH): Secondary | ICD-10-CM | POA: Diagnosis not present

## 2018-04-04 DIAGNOSIS — F419 Anxiety disorder, unspecified: Secondary | ICD-10-CM | POA: Diagnosis not present

## 2018-04-04 DIAGNOSIS — Z794 Long term (current) use of insulin: Secondary | ICD-10-CM | POA: Diagnosis not present

## 2018-04-04 DIAGNOSIS — R188 Other ascites: Secondary | ICD-10-CM | POA: Diagnosis not present

## 2018-04-04 DIAGNOSIS — K219 Gastro-esophageal reflux disease without esophagitis: Secondary | ICD-10-CM | POA: Diagnosis not present

## 2018-04-04 DIAGNOSIS — E7800 Pure hypercholesterolemia, unspecified: Secondary | ICD-10-CM | POA: Diagnosis not present

## 2018-04-04 HISTORY — DX: Unspecified osteoarthritis, unspecified site: M19.90

## 2018-04-04 HISTORY — DX: Unspecified hearing loss, unspecified ear: H91.90

## 2018-04-04 HISTORY — DX: Gastro-esophageal reflux disease without esophagitis: K21.9

## 2018-04-04 HISTORY — DX: Anxiety disorder, unspecified: F41.9

## 2018-04-04 HISTORY — DX: Sleep apnea, unspecified: G47.30

## 2018-04-04 HISTORY — DX: Essential (primary) hypertension: I10

## 2018-04-04 LAB — COMPREHENSIVE METABOLIC PANEL
AG Ratio: 0.9 (calc) — ABNORMAL LOW (ref 1.0–2.5)
ALKALINE PHOSPHATASE (APISO): 98 U/L (ref 40–115)
ALT: 24 U/L (ref 9–46)
AST: 48 U/L — ABNORMAL HIGH (ref 10–35)
Albumin: 2.9 g/dL — ABNORMAL LOW (ref 3.6–5.1)
BUN: 18 mg/dL (ref 7–25)
CHLORIDE: 106 mmol/L (ref 98–110)
CO2: 28 mmol/L (ref 20–32)
CREATININE: 1.05 mg/dL (ref 0.70–1.18)
Calcium: 9 mg/dL (ref 8.6–10.3)
GLOBULIN: 3.1 g/dL (ref 1.9–3.7)
Glucose, Bld: 197 mg/dL — ABNORMAL HIGH (ref 65–99)
Potassium: 4.3 mmol/L (ref 3.5–5.3)
Sodium: 138 mmol/L (ref 135–146)
Total Bilirubin: 1.4 mg/dL — ABNORMAL HIGH (ref 0.2–1.2)
Total Protein: 6 g/dL — ABNORMAL LOW (ref 6.1–8.1)

## 2018-04-04 LAB — CBC
HCT: 28.1 % — ABNORMAL LOW (ref 38.5–50.0)
Hemoglobin: 9.4 g/dL — ABNORMAL LOW (ref 13.2–17.1)
MCH: 31.4 pg (ref 27.0–33.0)
MCHC: 33.5 g/dL (ref 32.0–36.0)
MCV: 94 fL (ref 80.0–100.0)
MPV: 11.2 fL (ref 7.5–12.5)
PLATELETS: 78 10*3/uL — AB (ref 140–400)
RBC: 2.99 10*6/uL — AB (ref 4.20–5.80)
RDW: 12.8 % (ref 11.0–15.0)
WBC: 2.7 10*3/uL — ABNORMAL LOW (ref 3.8–10.8)

## 2018-04-04 LAB — PROTIME-INR
INR: 1.2 — AB
Prothrombin Time: 13.1 s — ABNORMAL HIGH (ref 9.0–11.5)

## 2018-04-04 NOTE — Telephone Encounter (Signed)
Good morning Dr. Karilyn Cotaehman. You asked me to let you know when Larry Hayes received the medication in the mail and it came yesterday. He is having those blood tests this morning so he will start the new medication later today. I asked him not to take it last night since it was a new medication and I wanted to be sure he can tolerate it. I will be sure he takes it every day and you can let Larry Hayes know when you want to have his blood checked. Thank you so very much for all you have done for Larry Hayes and the friendly way you have done so. As one of the Larry Hayes employee's told Larry Hayes about you: "Dr. Karilyn Cotaehman is the man"!! Hope you have a great day and stay safe from the storms that are predicted. Larry Hayes

## 2018-04-07 ENCOUNTER — Ambulatory Visit (HOSPITAL_COMMUNITY)
Admission: RE | Admit: 2018-04-07 | Discharge: 2018-04-07 | Disposition: A | Discharge status: Home / Self Care | Payer: Medicare Other | Source: Ambulatory Visit | Attending: Internal Medicine | Admitting: Internal Medicine

## 2018-04-07 ENCOUNTER — Ambulatory Visit (HOSPITAL_COMMUNITY): Payer: Medicare Other | Admitting: Anesthesiology

## 2018-04-07 ENCOUNTER — Encounter (HOSPITAL_COMMUNITY): Payer: Self-pay | Admitting: *Deleted

## 2018-04-07 ENCOUNTER — Encounter (HOSPITAL_COMMUNITY)
Admission: RE | Disposition: A | Discharge status: Home / Self Care | Payer: Self-pay | Source: Ambulatory Visit | Attending: Internal Medicine

## 2018-04-07 DIAGNOSIS — K7469 Other cirrhosis of liver: Secondary | ICD-10-CM

## 2018-04-07 DIAGNOSIS — K219 Gastro-esophageal reflux disease without esophagitis: Secondary | ICD-10-CM | POA: Insufficient documentation

## 2018-04-07 DIAGNOSIS — K7460 Unspecified cirrhosis of liver: Secondary | ICD-10-CM | POA: Insufficient documentation

## 2018-04-07 DIAGNOSIS — R188 Other ascites: Secondary | ICD-10-CM | POA: Diagnosis not present

## 2018-04-07 DIAGNOSIS — M1990 Unspecified osteoarthritis, unspecified site: Secondary | ICD-10-CM | POA: Insufficient documentation

## 2018-04-07 DIAGNOSIS — G4730 Sleep apnea, unspecified: Secondary | ICD-10-CM | POA: Insufficient documentation

## 2018-04-07 DIAGNOSIS — K766 Portal hypertension: Secondary | ICD-10-CM | POA: Insufficient documentation

## 2018-04-07 DIAGNOSIS — K7290 Hepatic failure, unspecified without coma: Secondary | ICD-10-CM | POA: Insufficient documentation

## 2018-04-07 DIAGNOSIS — Z13810 Encounter for screening for upper gastrointestinal disorder: Secondary | ICD-10-CM | POA: Diagnosis not present

## 2018-04-07 DIAGNOSIS — Z79899 Other long term (current) drug therapy: Secondary | ICD-10-CM | POA: Insufficient documentation

## 2018-04-07 DIAGNOSIS — Z87891 Personal history of nicotine dependence: Secondary | ICD-10-CM | POA: Insufficient documentation

## 2018-04-07 DIAGNOSIS — Z7982 Long term (current) use of aspirin: Secondary | ICD-10-CM | POA: Insufficient documentation

## 2018-04-07 DIAGNOSIS — E7800 Pure hypercholesterolemia, unspecified: Secondary | ICD-10-CM | POA: Insufficient documentation

## 2018-04-07 DIAGNOSIS — E119 Type 2 diabetes mellitus without complications: Secondary | ICD-10-CM | POA: Insufficient documentation

## 2018-04-07 DIAGNOSIS — K3189 Other diseases of stomach and duodenum: Secondary | ICD-10-CM

## 2018-04-07 DIAGNOSIS — K7581 Nonalcoholic steatohepatitis (NASH): Secondary | ICD-10-CM | POA: Insufficient documentation

## 2018-04-07 DIAGNOSIS — Z794 Long term (current) use of insulin: Secondary | ICD-10-CM | POA: Insufficient documentation

## 2018-04-07 DIAGNOSIS — F419 Anxiety disorder, unspecified: Secondary | ICD-10-CM | POA: Insufficient documentation

## 2018-04-07 DIAGNOSIS — K228 Other specified diseases of esophagus: Secondary | ICD-10-CM | POA: Diagnosis not present

## 2018-04-07 DIAGNOSIS — K760 Fatty (change of) liver, not elsewhere classified: Secondary | ICD-10-CM

## 2018-04-07 DIAGNOSIS — Z1389 Encounter for screening for other disorder: Secondary | ICD-10-CM | POA: Insufficient documentation

## 2018-04-07 DIAGNOSIS — I10 Essential (primary) hypertension: Secondary | ICD-10-CM | POA: Insufficient documentation

## 2018-04-07 HISTORY — PX: ESOPHAGOGASTRODUODENOSCOPY (EGD) WITH PROPOFOL: SHX5813

## 2018-04-07 HISTORY — PX: ESOPHAGEAL BANDING: SHX5518

## 2018-04-07 LAB — AMMONIA: AMMONIA: 47 umol/L — AB (ref 9–35)

## 2018-04-07 LAB — IRON AND TIBC
Iron: 38 ug/dL — ABNORMAL LOW (ref 45–182)
SATURATION RATIOS: 11 % — AB (ref 17.9–39.5)
TIBC: 340 ug/dL (ref 250–450)
UIBC: 302 ug/dL

## 2018-04-07 LAB — VITAMIN B12: VITAMIN B 12: 598 pg/mL (ref 180–914)

## 2018-04-07 LAB — FERRITIN: FERRITIN: 10 ng/mL — AB (ref 24–336)

## 2018-04-07 LAB — AFP TUMOR MARKER: AFP TUMOR MARKER: 2.1 ng/mL (ref <6.1)

## 2018-04-07 LAB — GLUCOSE, CAPILLARY: GLUCOSE-CAPILLARY: 197 mg/dL — AB (ref 65–99)

## 2018-04-07 SURGERY — ESOPHAGOGASTRODUODENOSCOPY (EGD) WITH PROPOFOL
Anesthesia: Monitor Anesthesia Care

## 2018-04-07 MED ORDER — ONDANSETRON HCL 4 MG/2ML IJ SOLN
INTRAMUSCULAR | Status: DC | PRN
  Administered 2018-04-07: 4 mg via INTRAVENOUS

## 2018-04-07 MED ORDER — CHLORHEXIDINE GLUCONATE CLOTH 2 % EX PADS: 6.0000 | MEDICATED_PAD | Freq: Once | CUTANEOUS | Status: DC

## 2018-04-07 MED ORDER — LACTATED RINGERS IV SOLN
INTRAVENOUS | Status: DC
  Administered 2018-04-07: 1000 mL via INTRAVENOUS

## 2018-04-07 MED ORDER — PROPOFOL 500 MG/50ML IV EMUL
INTRAVENOUS | Status: DC | PRN
  Administered 2018-04-07: 100 ug/kg/min via INTRAVENOUS

## 2018-04-07 MED ORDER — MIDAZOLAM HCL 5 MG/5ML IJ SOLN
INTRAMUSCULAR | Status: DC | PRN
  Administered 2018-04-07 (×2): 1 mg via INTRAVENOUS

## 2018-04-07 MED ORDER — GLYCOPYRROLATE 0.2 MG/ML IJ SOLN
INTRAMUSCULAR | Status: DC | PRN
  Administered 2018-04-07: 0.2 mg via INTRAVENOUS

## 2018-04-07 MED ORDER — LACTATED RINGERS IV SOLN: INTRAVENOUS | Status: DC

## 2018-04-07 MED ORDER — LIDOCAINE VISCOUS 2 % MT SOLN
OROMUCOSAL | Status: AC
  Filled 2018-04-07: qty 15

## 2018-04-07 MED ORDER — GLYCOPYRROLATE 0.2 MG/ML IJ SOLN
INTRAMUSCULAR | Status: AC
  Filled 2018-04-07: qty 1

## 2018-04-07 MED ORDER — LIDOCAINE VISCOUS 2 % MT SOLN
5.0000 mL | Freq: Two times a day (BID) | OROMUCOSAL | Status: AC
  Administered 2018-04-07 (×2): 5 mL via OROMUCOSAL

## 2018-04-07 MED ORDER — MIDAZOLAM HCL 2 MG/2ML IJ SOLN
INTRAMUSCULAR | Status: AC
  Filled 2018-04-07: qty 2

## 2018-04-07 MED ORDER — ONDANSETRON HCL 4 MG/2ML IJ SOLN
INTRAMUSCULAR | Status: AC
  Filled 2018-04-07: qty 2

## 2018-04-07 MED ORDER — FUROSEMIDE 40 MG PO TABS: 40.0000 mg | ORAL_TABLET | Freq: Every day | ORAL | 1 refills | Status: DC

## 2018-04-07 NOTE — Anesthesia Procedure Notes (Signed)
Anesthesia Regional Block: Bier block (IV Regional)  Narrative:

## 2018-04-07 NOTE — Anesthesia Preprocedure Evaluation (Signed)
Anesthesia Evaluation  Patient identified by MRN, date of birth, ID band Patient awake    Reviewed: Allergy & Precautions, H&P , NPO status , Patient's Chart, lab work & pertinent test results, reviewed documented beta blocker date and time   Airway Mallampati: II  TM Distance: >3 FB Neck ROM: full    Dental no notable dental hx. (+) Edentulous Upper, Edentulous Lower   Pulmonary neg pulmonary ROS, former smoker,    Pulmonary exam normal breath sounds clear to auscultation       Cardiovascular Exercise Tolerance: Good hypertension, negative cardio ROS Normal cardiovascular exam Rhythm:regular Rate:Normal     Neuro/Psych negative neurological ROS  negative psych ROS   GI/Hepatic negative GI ROS, Neg liver ROS,   Endo/Other  negative endocrine ROSdiabetes  Renal/GU negative Renal ROS  negative genitourinary   Musculoskeletal   Abdominal   Peds  Hematology negative hematology ROS (+)   Anesthesia Other Findings No clinical complaints BS= 196  Reproductive/Obstetrics negative OB ROS                             Anesthesia Physical Anesthesia Plan  ASA: III  Anesthesia Plan: MAC   Post-op Pain Management:    Induction:   PONV Risk Score and Plan:   Airway Management Planned:   Additional Equipment:   Intra-op Plan:   Post-operative Plan:   Informed Consent: I have reviewed the patients History and Physical, chart, labs and discussed the procedure including the risks, benefits and alternatives for the proposed anesthesia with the patient or authorized representative who has indicated his/her understanding and acceptance.   Dental Advisory Given  Plan Discussed with: CRNA  Anesthesia Plan Comments:         Anesthesia Quick Evaluation

## 2018-04-07 NOTE — Anesthesia Postprocedure Evaluation (Signed)
Anesthesia Post Note  Patient: Larry Hayes  Procedure(s) Performed: ESOPHAGOGASTRODUODENOSCOPY (EGD) WITH PROPOFOL (N/A ) ESOPHAGEAL BANDING (N/A )  Patient location during evaluation: PACU Anesthesia Type: MAC Level of consciousness: awake and alert Pain management: pain level controlled Vital Signs Assessment: post-procedure vital signs reviewed and stable Respiratory status: spontaneous breathing, nonlabored ventilation, respiratory function stable and patient connected to nasal cannula oxygen Cardiovascular status: stable and blood pressure returned to baseline Postop Assessment: no apparent nausea or vomiting Anesthetic complications: no     Last Vitals:  Vitals:   04/07/18 1115 04/07/18 1128  BP: 138/70 123/61  Pulse: 88 91  Resp: 18 16  Temp:  37.1 C  SpO2: 94% 96%    Last Pain:  Vitals:   04/07/18 1128  TempSrc: Oral  PainSc: 0-No pain                 Benay Pike

## 2018-04-07 NOTE — Op Note (Signed)
[facility] Patient Name: Larry Hayes Procedure Date: 04/07/2018 10:32 AM MRN: [id number] Date of Birth: October 27, 1946 Attending MD: Hildred Laser , MD CSN: [id number] Age: 72 Admit Type: Outpatient Procedure:                Upper GI endoscopy Indications:              Screening procedure, Cirrhosis rule out esophageal                            varices Providers:                Hildred Laser, MD, Otis Peak B. Sharon Seller, RN, Hinton Rao, RN Referring MD:             Olena Mater, MD Medicines:                Lidocaine spray, Propofol per Anesthesia Complications:            No immediate complications. Estimated Blood Loss:     Estimated blood loss: none. Procedure:                Pre-Anesthesia Assessment:                           - Prior to the procedure, a History and Physical                            was performed, and patient medications and                            allergies were reviewed. The patient's tolerance of                            previous anesthesia was also reviewed. The risks                            and benefits of the procedure and the sedation                            options and risks were discussed with the patient.                            All questions were answered, and informed consent                            was obtained. Prior Anticoagulants: The patient                            last took aspirin 3 days prior to the procedure.                            ASA Grade Assessment: III - A patient with severe  systemic disease. After reviewing the risks and                            benefits, the patient was deemed in satisfactory                            condition to undergo the procedure.                           After obtaining informed consent, the endoscope was                            passed under direct vision. Throughout the                            procedure, the patient's  blood pressure, pulse, and                            oxygen saturations were monitored continuously. The                            [id number] ([id number]) scope was introduced through the                            and advanced to the second part of duodenum. The                            upper GI endoscopy was accomplished without                            difficulty. The patient tolerated the procedure                            well. Scope In: 10:49:57 AM Scope Out: 10:54:36 AM Total Procedure Duration: 0 hours 4 minutes 39 seconds  Findings:      The examined esophagus was normal.      The Z-line was irregular and was found 40 cm from the incisors.      Moderate portal hypertensive gastropathy was found in the gastric       fundus, in the gastric body and in the gastric antrum.      The exam of the stomach was otherwise normal.      The duodenal bulb and second portion of the duodenum were normal. Impression:               - Normal esophagus.                           - Z-line irregular, 40 cm from the incisors.                           - Portal hypertensive gastropathy.                           - Normal duodenal bulb and second portion of the  duodenum.                           - No specimens collected. Moderate Sedation:      Per Anesthesia Care Recommendation:           - Patient has a contact number available for                            emergencies. The signs and symptoms of potential                            delayed complications were discussed with the                            patient. Return to normal activities tomorrow.                            Written discharge instructions were provided to the                            patient.                           - Resume previous diet today.                           - Continue present medications.                           - Resume aspirin at prior dose today. Procedure Code(s):         --- Professional ---                           [phone], Esophagogastroduodenoscopy, flexible,                            transoral; diagnostic, including collection of                            specimen(s) by brushing or washing, when performed                            (separate procedure) Diagnosis Code(s):        --- Professional ---                           K22.8, Other specified diseases of esophagus                           K76.6, Portal hypertension                           K31.89, Other diseases of stomach and duodenum                           Z13.810, Encounter for screening for upper  gastrointestinal disorder                           K74.60, Unspecified cirrhosis of liver CPT copyright 2017 American [employer] Association. All rights reserved. The codes documented in this report are preliminary and upon coder review may  be revised to meet current compliance requirements. Hildred Laser, MD Hildred Laser, MD 04/07/2018 11:02:32 AM This report has been signed electronically. Number of Addenda: 0

## 2018-04-07 NOTE — H&P (Signed)
Larry Hayes is an 72 y.o. male.   Chief Complaint: Patient is here for EGD and possible esophageal variceal banding. HPI: Patient is 72 year old Caucasian male who has cirrhosis secondary to [facility] complicated by ascites and hepatic encephalopathy who is here to evaluate and treat for esophageal varices.  Last exam was in August 2017.  He was found to have portal hypertensive gastropathy but no varices.  He denies nausea vomiting abdominal pain melena or rectal bleeding. Lab studies from last week show drop in hemoglobin. This will be further evaluated with iron studies and B12. Patient states he has been on Xifaxan for 1 week.  We will also check serum ammonia.  Past Medical History:  Diagnosis Date  . Anemia   . Anxiety   . Arthritis   . Diabetes ([place])   . GERD (gastroesophageal reflux disease)   . Hepatic cirrhosis ([place]) 08/21/2016  . High cholesterol   . HOH (hard of hearing)   . Hypertension   . Sleep apnea   . Thrombocytopenia ([place]) 08/21/2016    Past Surgical History:  Procedure Laterality Date  . CATARACT EXTRACTION Right 11/2016   also had left eye done  . ESOPHAGOGASTRODUODENOSCOPY N/A 08/01/2016   Procedure: ESOPHAGOGASTRODUODENOSCOPY (EGD);  Surgeon: Rogene Houston, MD;  [facility];  Service: Endoscopy;  Laterality: N/A;  2:45  . HERNIA REPAIR     bilateral inguinal age 22  . KNEE SURGERY     rt arthroscopy    History reviewed. No pertinent family history. Social History:  reports that he quit smoking about 16 years ago. His smoking use included cigarettes. He smoked 1.50 packs per day. He has never used smokeless tobacco. He reports that he does not drink alcohol or use drugs.  Allergies: No Known Allergies  Medications Prior to Admission  Medication Sig Dispense Refill  . aspirin EC 81 MG tablet Take 81 mg by mouth daily.    Marland Kitchen atorvastatin (LIPITOR) 80 MG tablet Take 80 mg by mouth at bedtime.    . furosemide (LASIX) 40 MG tablet Take 1 tablet (40 mg  total) by mouth daily. 30 tablet 2  . insulin glargine (LANTUS) 100 UNIT/ML injection Inject 40 Units into the skin at bedtime.     Marland Kitchen lactulose (CHRONULAC) 10 GM/15ML solution Take 15 mLs (10 g total) by mouth 2 (two) times daily. 946 mL 11  . metFORMIN (GLUCOPHAGE) 1000 MG tablet Take 1,000 mg by mouth 2 (two) times daily with a meal.     . Multiple Vitamin (STRESS B PO) Take 1 tablet by mouth daily.    . Multiple Vitamins-Minerals (MULTIVITAMIN PO) Take 1 tablet by mouth daily.    Marland Kitchen omeprazole (PRILOSEC) 20 MG capsule Take 1 capsule (20 mg total) by mouth daily. 90 capsule 3  . sertraline (ZOLOFT) 50 MG tablet Take 50 mg by mouth daily.     Marland Kitchen spironolactone (ALDACTONE) 50 MG tablet Take 2 tablets (100 mg total) by mouth daily. 60 tablet 5    Results for orders placed or performed during the hospital encounter of 04/07/18 (from the past 48 hour(s))  Glucose, capillary     Status: Abnormal   Collection Time: 04/07/18  9:31 AM  Result Value Ref Range   Glucose-Capillary 197 (H) 65 - 99 mg/dL   No results found.  ROS  Blood pressure 119/64, pulse 80, temperature 98 F (36.7 C), temperature source Oral, resp. rate (!) 22, SpO2 94 %. Physical Exam  Constitutional: He appears well-developed and  well-nourished.  HENT:  Mouth/Throat: Oropharynx is clear and moist.  Eyes: No scleral icterus.  Conjunctiva is pale.  Neck: No thyromegaly present.  Cardiovascular: Normal rate, regular rhythm and normal heart sounds.  No murmur heard. Respiratory: Effort normal and breath sounds normal.  GI:  Abdomen is full but soft and nontender without organomegaly or masses.  Musculoskeletal: He exhibits no edema.  Lymphadenopathy:    He has no cervical adenopathy.  Neurological: He is alert.  No asterixis.  Skin: Skin is warm and dry.     Assessment/Plan Cirrhosis secondary to NASH. EGD to screen for and treat esophageal varices  Hildred Laser, MD 04/07/2018, 10:36 AM

## 2018-04-07 NOTE — Discharge Instructions (Signed)
Resume usual medications including aspirin as before. Resume diet as before. No driving for 24 hours. Physician will call with results of blood test.

## 2018-04-07 NOTE — Transfer of Care (Signed)
Immediate Anesthesia Transfer of Care Note  Patient: Larry Hayes  Procedure(s) Performed: ESOPHAGOGASTRODUODENOSCOPY (EGD) WITH PROPOFOL (N/A ) ESOPHAGEAL BANDING (N/A )  Patient Location: PACU  Anesthesia Type:MAC  Level of Consciousness: drowsy  Airway & Oxygen Therapy: Patient Spontanous Breathing and Patient connected to nasal cannula oxygen  Post-op Assessment: Report given to RN, Post -op Vital signs reviewed and stable and Patient moving all extremities  Post vital signs: Reviewed and stable  Last Vitals:  Vitals Value Taken Time  BP    Temp    Pulse 90 04/07/2018 11:05 AM  Resp 16 04/07/2018 11:05 AM  SpO2 93 % 04/07/2018 11:05 AM  Vitals shown include unvalidated device data.  Last Pain:  Vitals:   04/07/18 1044  TempSrc:   PainSc: 0-No pain      Patients Stated Pain Goal: 5 (28/36/62 9476)  Complications: No apparent anesthesia complications

## 2018-04-08 ENCOUNTER — Encounter (INDEPENDENT_AMBULATORY_CARE_PROVIDER_SITE_OTHER): Payer: Self-pay | Admitting: Internal Medicine

## 2018-04-09 ENCOUNTER — Encounter (HOSPITAL_COMMUNITY): Payer: Self-pay | Admitting: Internal Medicine

## 2018-04-09 ENCOUNTER — Encounter (INDEPENDENT_AMBULATORY_CARE_PROVIDER_SITE_OTHER): Payer: Self-pay | Admitting: *Deleted

## 2018-04-09 ENCOUNTER — Other Ambulatory Visit (INDEPENDENT_AMBULATORY_CARE_PROVIDER_SITE_OTHER): Payer: Self-pay | Admitting: *Deleted

## 2018-04-09 DIAGNOSIS — K7460 Unspecified cirrhosis of liver: Secondary | ICD-10-CM

## 2018-04-09 DIAGNOSIS — D508 Other iron deficiency anemias: Secondary | ICD-10-CM

## 2018-04-09 DIAGNOSIS — R188 Other ascites: Secondary | ICD-10-CM

## 2018-04-15 ENCOUNTER — Encounter (INDEPENDENT_AMBULATORY_CARE_PROVIDER_SITE_OTHER): Payer: Self-pay | Admitting: Internal Medicine

## 2018-04-25 ENCOUNTER — Encounter (INDEPENDENT_AMBULATORY_CARE_PROVIDER_SITE_OTHER): Payer: Self-pay | Admitting: Internal Medicine

## 2018-05-20 ENCOUNTER — Other Ambulatory Visit (INDEPENDENT_AMBULATORY_CARE_PROVIDER_SITE_OTHER): Payer: Self-pay | Admitting: *Deleted

## 2018-05-20 DIAGNOSIS — R188 Other ascites: Secondary | ICD-10-CM

## 2018-05-20 DIAGNOSIS — K7460 Unspecified cirrhosis of liver: Secondary | ICD-10-CM

## 2018-05-21 ENCOUNTER — Ambulatory Visit (HOSPITAL_COMMUNITY)
Admission: RE | Admit: 2018-05-21 | Discharge: 2018-05-21 | Disposition: A | Discharge status: Home / Self Care | Payer: Medicare Other | Source: Ambulatory Visit | Attending: Internal Medicine | Admitting: Internal Medicine

## 2018-05-21 ENCOUNTER — Encounter (HOSPITAL_COMMUNITY): Payer: Self-pay

## 2018-05-21 DIAGNOSIS — R188 Other ascites: Secondary | ICD-10-CM | POA: Diagnosis not present

## 2018-05-21 DIAGNOSIS — K7460 Unspecified cirrhosis of liver: Secondary | ICD-10-CM | POA: Diagnosis not present

## 2018-05-21 LAB — BODY FLUID CELL COUNT WITH DIFFERENTIAL
EOS FL: 0 %
Lymphs, Fluid: 88 %
Monocyte-Macrophage-Serous Fluid: 9 % — ABNORMAL LOW (ref 50–90)
Neutrophil Count, Fluid: 3 % (ref 0–25)
Total Nucleated Cell Count, Fluid: 284 cu mm (ref 0–1000)

## 2018-05-21 LAB — GRAM STAIN

## 2018-05-21 NOTE — Progress Notes (Signed)
Paracentesis complete no signs of distress.  

## 2018-05-21 NOTE — Procedures (Signed)
PreOperative Dx: Cirrhosis, ascites Postoperative Dx: Cirrhosis, ascites Procedure:   US guided paracentesis Radiologist:  Camren Henthorn Anesthesia:  10 ml of1% lidocaine Specimen:  4.8 L of yellow ascitic fluid EBL:   < 1 ml Complications: None  

## 2018-05-23 LAB — PATHOLOGIST SMEAR REVIEW

## 2018-05-26 LAB — CULTURE, BODY FLUID-BOTTLE: CULTURE: NO GROWTH

## 2018-05-26 LAB — CULTURE, BODY FLUID W GRAM STAIN -BOTTLE

## 2018-06-25 ENCOUNTER — Encounter (INDEPENDENT_AMBULATORY_CARE_PROVIDER_SITE_OTHER): Payer: Self-pay | Admitting: Internal Medicine

## 2018-07-01 ENCOUNTER — Encounter (INDEPENDENT_AMBULATORY_CARE_PROVIDER_SITE_OTHER): Payer: Self-pay | Admitting: Internal Medicine

## 2018-07-01 ENCOUNTER — Telehealth (INDEPENDENT_AMBULATORY_CARE_PROVIDER_SITE_OTHER): Payer: Self-pay

## 2018-07-01 ENCOUNTER — Other Ambulatory Visit (INDEPENDENT_AMBULATORY_CARE_PROVIDER_SITE_OTHER): Payer: Self-pay | Admitting: *Deleted

## 2018-07-01 DIAGNOSIS — K7469 Other cirrhosis of liver: Secondary | ICD-10-CM

## 2018-07-01 DIAGNOSIS — R188 Other ascites: Secondary | ICD-10-CM

## 2018-07-01 NOTE — Telephone Encounter (Signed)
Patient called stated he needs to schedule a paracentesis - please call him back

## 2018-07-01 NOTE — Progress Notes (Signed)
Per Dr.Rehman the patient may have US Paracentesis.

## 2018-07-01 NOTE — Telephone Encounter (Signed)
Order has been placed. Sent to referral coordinator.

## 2018-07-02 NOTE — Progress Notes (Signed)
Spoke to patient's wife and she wants him to see Terri tomorrow before we schedule paracentesis

## 2018-07-03 ENCOUNTER — Encounter (HOSPITAL_COMMUNITY): Payer: Self-pay

## 2018-07-03 ENCOUNTER — Encounter (INDEPENDENT_AMBULATORY_CARE_PROVIDER_SITE_OTHER): Payer: Self-pay | Admitting: Internal Medicine

## 2018-07-03 ENCOUNTER — Ambulatory Visit (INDEPENDENT_AMBULATORY_CARE_PROVIDER_SITE_OTHER): Payer: Medicare Other | Admitting: Internal Medicine

## 2018-07-03 ENCOUNTER — Ambulatory Visit (HOSPITAL_COMMUNITY)
Admission: RE | Admit: 2018-07-03 | Discharge: 2018-07-03 | Disposition: A | Discharge status: Home / Self Care | Payer: Medicare Other | Source: Ambulatory Visit | Attending: Internal Medicine | Admitting: Internal Medicine

## 2018-07-03 DIAGNOSIS — K760 Fatty (change of) liver, not elsewhere classified: Secondary | ICD-10-CM | POA: Diagnosis not present

## 2018-07-03 DIAGNOSIS — K7469 Other cirrhosis of liver: Secondary | ICD-10-CM | POA: Diagnosis present

## 2018-07-03 DIAGNOSIS — R188 Other ascites: Secondary | ICD-10-CM | POA: Diagnosis present

## 2018-07-03 LAB — BODY FLUID CELL COUNT WITH DIFFERENTIAL
Eos, Fluid: 0 %
LYMPHS FL: 88 %
Monocyte-Macrophage-Serous Fluid: 10 % — ABNORMAL LOW (ref 50–90)
Neutrophil Count, Fluid: 2 % (ref 0–25)
OTHER CELLS FL: REACTIVE %
Total Nucleated Cell Count, Fluid: 200 cu mm (ref 0–1000)

## 2018-07-03 LAB — GRAM STAIN: GRAM STAIN: NONE SEEN

## 2018-07-03 MED ORDER — ALBUMIN HUMAN 25 % IV SOLN
50.0000 g | Freq: Once | INTRAVENOUS | Status: AC
  Administered 2018-07-03: 50 g via INTRAVENOUS

## 2018-07-03 MED ORDER — ALBUMIN HUMAN 25 % IV SOLN
INTRAVENOUS | Status: AC
  Administered 2018-07-03: 50 g via INTRAVENOUS
  Filled 2018-07-03: qty 200

## 2018-07-03 MED ORDER — FUROSEMIDE 40 MG PO TABS: 40.0000 mg | ORAL_TABLET | Freq: Every day | ORAL | 3 refills | Status: DC

## 2018-07-03 NOTE — Progress Notes (Signed)
Paracentesis complete no signs of distress.  

## 2018-07-03 NOTE — Patient Instructions (Signed)
Labs and paracentesis.

## 2018-07-03 NOTE — Progress Notes (Signed)
Subjective:    Patient ID: Larry PeaceHenry Hayes, male    DOB: 22-Oct-1946, 72 y.o.   MRN: [id number]  HPI Presents today with c/o abdominal swelling. 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  Last paracentesis was 05/21/2018 with 4.8 L of fluid.  Abdominal swelling for about a week. Ankle swollen x 1 1/2 weeks.  Eating one meal a day. States he really doesn't want to eat. "Hard to eat when u are not hungry". Having about 5-6 stools a day.   Review of Systems Past Medical History:  Diagnosis Date  . Anemia   . Anxiety   . Arthritis   . Diabetes ([facility])   . GERD (gastroesophageal reflux disease)   . 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 High cholesterol   . HOH (hard of hearing)   . Hypertension   . Sleep apnea   . Thrombocytopenia ([facility]) 08/21/2016    Past Surgical History:  Procedure Laterality Date  . CATARACT EXTRACTION Right 11/2016   also had left eye done  . ESOPHAGEAL BANDING N/A 04/07/2018   Procedure: ESOPHAGEAL BANDING;  Surgeon: Malissa Hippoehman, Najeeb U, MD;  [facility];  Service: Endoscopy;  Laterality: N/A;  . ESOPHAGOGASTRODUODENOSCOPY N/A 08/01/2016   Procedure: ESOPHAGOGASTRODUODENOSCOPY (EGD);  Surgeon: Malissa HippoNajeeb U Rehman, MD;  [facility];  Service: Endoscopy;  Laterality: N/A;  2:45  . ESOPHAGOGASTRODUODENOSCOPY (EGD) WITH PROPOFOL N/A 04/07/2018   Procedure: ESOPHAGOGASTRODUODENOSCOPY (EGD) WITH PROPOFOL;  Surgeon: Malissa Hippoehman, Najeeb U, MD;  [facility];  Service: Endoscopy;  Laterality: N/A;  . HERNIA REPAIR     bilateral inguinal age 72  . KNEE SURGERY     rt arthroscopy    No Known Allergies  Current Outpatient Medications on File Prior to Visit  Medication Sig Dispense Refill  . aspirin EC 81 MG tablet Take 81 mg by mouth daily.    Marland Kitchen. atorvastatin (LIPITOR) 80 MG tablet Take 80 mg by mouth at bedtime.    . insulin glargine (LANTUS) 100 UNIT/ML injection Inject 40 Units into the skin at bedtime.       . isosorbide dinitrate (ISORDIL) 30 MG tablet Take 30 mg by mouth 4 (four) times daily.    Marland Kitchen. lactulose (CHRONULAC) 10 GM/15ML solution Take 15 mLs (10 g total) by mouth 2 (two) times daily. 946 mL 11  . lisinopril (PRINIVIL,ZESTRIL) 5 MG tablet Take 5 mg by mouth daily.    . metFORMIN (GLUCOPHAGE) 1000 MG tablet Take 1,000 mg by mouth 2 (two) times daily with a meal.     . metoprolol succinate (TOPROL-XL) 25 MG 24 hr tablet Take 25 mg by mouth daily.    . Multiple Vitamin (STRESS B PO) Take 1 tablet by mouth daily.    . Multiple Vitamins-Minerals (MULTIVITAMIN PO) Take 1 tablet by mouth daily.    Marland Kitchen. omeprazole (PRILOSEC) 20 MG capsule Take 1 capsule (20 mg total) by mouth daily. 90 capsule 3  . sertraline (ZOLOFT) 50 MG tablet Take 50 mg by mouth daily.     Marland Kitchen. spironolactone (ALDACTONE) 50 MG tablet Take 2 tablets (100 mg total) by mouth daily. 60 tablet 5   No current facility-administered medications on file prior to visit.         Objective:   Physical Exam Blood pressure 130/65, pulse 70, temperature (!) 97 F (36.1 C), height 5\' 9"  (1.753 m), weight 264 lb 4.8 oz (119.9 kg). Alert and oriented. Skin warm and dry. Oral mucosa is  moist.   . Sclera anicteric, conjunctivae is pink. Thyroid not enlarged. No cervical lymphadenopathy. Lungs clear. Heart regular rate and rhythm.  Abdomen is soft. Bowel sounds are positive. No hepatomegaly. No abdominal masses felt. No tenderness. 2+ edema to lower extremities. Patient is alert and oriented. Abdomen distended. Orange peel skin noted to lower abdomen   No asterixis.       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   Last paracentesis 05/21/2018.  Will schedule an US paracentesis today.

## 2018-07-04 ENCOUNTER — Other Ambulatory Visit (INDEPENDENT_AMBULATORY_CARE_PROVIDER_SITE_OTHER): Payer: Self-pay | Admitting: *Deleted

## 2018-07-04 DIAGNOSIS — K760 Fatty (change of) liver, not elsewhere classified: Secondary | ICD-10-CM

## 2018-07-04 LAB — CBC
HEMATOCRIT: 26.9 % — AB (ref 38.5–50.0)
Hemoglobin: 8.8 g/dL — ABNORMAL LOW (ref 13.2–17.1)
MCH: 31.1 pg (ref 27.0–33.0)
MCHC: 32.7 g/dL (ref 32.0–36.0)
MCV: 95.1 fL (ref 80.0–100.0)
MPV: 11.6 fL (ref 7.5–12.5)
PLATELETS: 84 10*3/uL — AB (ref 140–400)
RBC: 2.83 10*6/uL — ABNORMAL LOW (ref 4.20–5.80)
RDW: 13.6 % (ref 11.0–15.0)
WBC: 4 10*3/uL (ref 3.8–10.8)

## 2018-07-04 LAB — HEPATIC FUNCTION PANEL
AG RATIO: 1 (calc) (ref 1.0–2.5)
ALKALINE PHOSPHATASE (APISO): 113 U/L (ref 40–115)
ALT: 19 U/L (ref 9–46)
AST: 42 U/L — ABNORMAL HIGH (ref 10–35)
Albumin: 3.3 g/dL — ABNORMAL LOW (ref 3.6–5.1)
BILIRUBIN DIRECT: 0.5 mg/dL — AB (ref 0.0–0.2)
BILIRUBIN INDIRECT: 0.9 mg/dL (ref 0.2–1.2)
GLOBULIN: 3.2 g/dL (ref 1.9–3.7)
TOTAL PROTEIN: 6.5 g/dL (ref 6.1–8.1)
Total Bilirubin: 1.4 mg/dL — ABNORMAL HIGH (ref 0.2–1.2)

## 2018-07-04 LAB — PATHOLOGIST SMEAR REVIEW

## 2018-07-04 LAB — AMMONIA: AMMONIA: 46 umol/L (ref <=72)

## 2018-07-07 ENCOUNTER — Other Ambulatory Visit (INDEPENDENT_AMBULATORY_CARE_PROVIDER_SITE_OTHER): Payer: Self-pay | Admitting: *Deleted

## 2018-07-07 ENCOUNTER — Telehealth (INDEPENDENT_AMBULATORY_CARE_PROVIDER_SITE_OTHER): Payer: Self-pay | Admitting: Internal Medicine

## 2018-07-07 ENCOUNTER — Encounter (INDEPENDENT_AMBULATORY_CARE_PROVIDER_SITE_OTHER): Payer: Self-pay | Admitting: *Deleted

## 2018-07-07 DIAGNOSIS — K760 Fatty (change of) liver, not elsewhere classified: Secondary | ICD-10-CM

## 2018-07-07 DIAGNOSIS — K7469 Other cirrhosis of liver: Secondary | ICD-10-CM

## 2018-07-07 NOTE — Telephone Encounter (Signed)
Ammonia Level noted and the patient will be sent reminder

## 2018-07-07 NOTE — Telephone Encounter (Signed)
Larry Hayes, ammonia in 4 weeks.

## 2018-07-08 ENCOUNTER — Ambulatory Visit (INDEPENDENT_AMBULATORY_CARE_PROVIDER_SITE_OTHER): Payer: Medicare Other | Admitting: Internal Medicine

## 2018-07-08 LAB — CULTURE, BODY FLUID W GRAM STAIN -BOTTLE: Culture: NO GROWTH

## 2018-07-08 LAB — CULTURE, BODY FLUID-BOTTLE

## 2018-07-29 ENCOUNTER — Telehealth (INDEPENDENT_AMBULATORY_CARE_PROVIDER_SITE_OTHER): Payer: Self-pay | Admitting: Internal Medicine

## 2018-07-29 ENCOUNTER — Other Ambulatory Visit (INDEPENDENT_AMBULATORY_CARE_PROVIDER_SITE_OTHER): Payer: Self-pay | Admitting: Internal Medicine

## 2018-07-29 ENCOUNTER — Encounter (INDEPENDENT_AMBULATORY_CARE_PROVIDER_SITE_OTHER): Payer: Self-pay

## 2018-07-29 DIAGNOSIS — K7460 Unspecified cirrhosis of liver: Secondary | ICD-10-CM

## 2018-07-29 DIAGNOSIS — R188 Other ascites: Principal | ICD-10-CM

## 2018-07-29 NOTE — Telephone Encounter (Signed)
I have spoken with patient. US paracentesis ordered.

## 2018-07-29 NOTE — Progress Notes (Signed)
U

## 2018-07-29 NOTE — Telephone Encounter (Signed)
US paracentesis

## 2018-07-29 NOTE — Telephone Encounter (Signed)
sch'd 07/31/18, MyChart message sent to patient

## 2018-07-31 ENCOUNTER — Encounter (HOSPITAL_COMMUNITY): Payer: Self-pay

## 2018-07-31 ENCOUNTER — Ambulatory Visit (HOSPITAL_COMMUNITY)
Admission: RE | Admit: 2018-07-31 | Discharge: 2018-07-31 | Disposition: A | Discharge status: Home / Self Care | Payer: Medicare Other | Source: Ambulatory Visit | Attending: Internal Medicine | Admitting: Internal Medicine

## 2018-07-31 DIAGNOSIS — K7460 Unspecified cirrhosis of liver: Secondary | ICD-10-CM | POA: Diagnosis not present

## 2018-07-31 DIAGNOSIS — R188 Other ascites: Secondary | ICD-10-CM | POA: Insufficient documentation

## 2018-07-31 LAB — BODY FLUID CELL COUNT WITH DIFFERENTIAL
Eos, Fluid: 0 %
Lymphs, Fluid: 79 %
MONOCYTE-MACROPHAGE-SEROUS FLUID: 19 % — AB (ref 50–90)
Neutrophil Count, Fluid: 2 % (ref 0–25)
Other Cells, Fluid: REACTIVE %
WBC FLUID: 201 uL (ref 0–1000)

## 2018-07-31 LAB — GRAM STAIN: GRAM STAIN: NONE SEEN

## 2018-07-31 NOTE — Progress Notes (Signed)
Paracentesis complete no signs of distress.  

## 2018-08-01 ENCOUNTER — Other Ambulatory Visit (INDEPENDENT_AMBULATORY_CARE_PROVIDER_SITE_OTHER): Payer: Self-pay | Admitting: *Deleted

## 2018-08-01 ENCOUNTER — Encounter (INDEPENDENT_AMBULATORY_CARE_PROVIDER_SITE_OTHER): Payer: Self-pay | Admitting: *Deleted

## 2018-08-01 DIAGNOSIS — K7460 Unspecified cirrhosis of liver: Secondary | ICD-10-CM

## 2018-08-01 DIAGNOSIS — R188 Other ascites: Principal | ICD-10-CM

## 2018-08-01 LAB — HEMOGLOBIN AND HEMATOCRIT, BLOOD
HEMATOCRIT: 26.9 % — AB (ref 38.5–50.0)
HEMOGLOBIN: 8.8 g/dL — AB (ref 13.2–17.1)

## 2018-08-01 LAB — AMMONIA: AMMONIA: 79 umol/L — AB (ref <=72)

## 2018-08-04 LAB — PATHOLOGIST SMEAR REVIEW

## 2018-08-05 ENCOUNTER — Encounter (INDEPENDENT_AMBULATORY_CARE_PROVIDER_SITE_OTHER): Payer: Self-pay

## 2018-08-05 LAB — CULTURE, BODY FLUID W GRAM STAIN -BOTTLE: Culture: NO GROWTH

## 2018-08-05 LAB — CULTURE, BODY FLUID-BOTTLE

## 2018-08-10 ENCOUNTER — Encounter (INDEPENDENT_AMBULATORY_CARE_PROVIDER_SITE_OTHER): Payer: Self-pay

## 2018-08-15 ENCOUNTER — Encounter (INDEPENDENT_AMBULATORY_CARE_PROVIDER_SITE_OTHER): Payer: Self-pay | Admitting: *Deleted

## 2018-08-18 ENCOUNTER — Other Ambulatory Visit (INDEPENDENT_AMBULATORY_CARE_PROVIDER_SITE_OTHER): Payer: Self-pay | Admitting: *Deleted

## 2018-08-18 DIAGNOSIS — K7469 Other cirrhosis of liver: Secondary | ICD-10-CM

## 2018-08-20 ENCOUNTER — Encounter (INDEPENDENT_AMBULATORY_CARE_PROVIDER_SITE_OTHER): Payer: Self-pay

## 2018-08-21 ENCOUNTER — Encounter (INDEPENDENT_AMBULATORY_CARE_PROVIDER_SITE_OTHER): Payer: Self-pay

## 2018-08-28 ENCOUNTER — Ambulatory Visit (HOSPITAL_COMMUNITY)
Admission: RE | Admit: 2018-08-28 | Discharge: 2018-08-28 | Disposition: A | Discharge status: Home / Self Care | Payer: Medicare Other | Source: Ambulatory Visit | Attending: Internal Medicine | Admitting: Internal Medicine

## 2018-08-28 DIAGNOSIS — R188 Other ascites: Secondary | ICD-10-CM | POA: Insufficient documentation

## 2018-08-28 DIAGNOSIS — K7469 Other cirrhosis of liver: Secondary | ICD-10-CM | POA: Diagnosis present

## 2018-08-31 ENCOUNTER — Encounter (INDEPENDENT_AMBULATORY_CARE_PROVIDER_SITE_OTHER): Payer: Self-pay

## 2018-09-04 ENCOUNTER — Telehealth (INDEPENDENT_AMBULATORY_CARE_PROVIDER_SITE_OTHER): Payer: Self-pay | Admitting: *Deleted

## 2018-09-04 NOTE — Telephone Encounter (Signed)
FYI: Dr. Karilyn Cota, I am so very sorry I missed your call. I had encouraged Larry Hayes to walk outside for a few minutes (he got too hot and it was very hard to get him back inside). Bonnie was in [facility] three nights week before last and was diagnosed with extremely high potassium and a bruised kidney. He goes back there for bloodwork and then has an appointment with an [place] kidney doctor first of October (if he makes it that long). He is barely eating anything and basically stays in the recliner or bed all day and night. He is "shuffling" instead of walking and his skin is very pale and his eyes sunk in. Thank you all for everything you have done for [name]. Regards, Ardeth Perfect

## 2018-09-08 ENCOUNTER — Telehealth (INDEPENDENT_AMBULATORY_CARE_PROVIDER_SITE_OTHER): Payer: Self-pay | Admitting: Internal Medicine

## 2018-09-08 DIAGNOSIS — R188 Other ascites: Secondary | ICD-10-CM

## 2018-09-08 NOTE — Telephone Encounter (Signed)
[name] para sch'd 09/11/18 at 900 (845), patient aware

## 2018-09-08 NOTE — Telephone Encounter (Signed)
US ordered

## 2018-09-08 NOTE — Telephone Encounter (Signed)
Wife left message stating patient needs fluid drawn off - please call [phone]

## 2018-09-08 NOTE — Telephone Encounter (Signed)
Larry Hayes, US paracentesis 

## 2018-09-10 NOTE — Telephone Encounter (Signed)
Larry Hayes, please talk with Larry Hayes. Wonder if she would agree with hospice

## 2018-09-11 ENCOUNTER — Encounter (HOSPITAL_COMMUNITY): Payer: Self-pay

## 2018-09-11 ENCOUNTER — Ambulatory Visit (HOSPITAL_COMMUNITY)
Admission: RE | Admit: 2018-09-11 | Discharge: 2018-09-11 | Disposition: A | Discharge status: Home / Self Care | Payer: Medicare Other | Source: Ambulatory Visit | Attending: Internal Medicine | Admitting: Internal Medicine

## 2018-09-11 DIAGNOSIS — R188 Other ascites: Secondary | ICD-10-CM | POA: Diagnosis present

## 2018-09-11 LAB — GRAM STAIN: GRAM STAIN: NONE SEEN

## 2018-09-11 LAB — BODY FLUID CELL COUNT WITH DIFFERENTIAL
Eos, Fluid: 0 %
Lymphs, Fluid: 52 %
Monocyte-Macrophage-Serous Fluid: 44 % — ABNORMAL LOW (ref 50–90)
Neutrophil Count, Fluid: 3 % (ref 0–25)
Other Cells, Fluid: 1 %
WBC FLUID: 111 uL (ref 0–1000)

## 2018-09-11 MED ORDER — ALBUMIN HUMAN 25 % IV SOLN
50.0000 g | Freq: Once | INTRAVENOUS | Status: AC
  Administered 2018-09-11: 50 g via INTRAVENOUS

## 2018-09-11 MED ORDER — ALBUMIN HUMAN 25 % IV SOLN
INTRAVENOUS | Status: AC
  Administered 2018-09-11: 50 g via INTRAVENOUS
  Filled 2018-09-11: qty 200

## 2018-09-11 NOTE — Progress Notes (Signed)
Paracentesis complete no signs of distress.  

## 2018-09-11 NOTE — Telephone Encounter (Signed)
Patient's wife will be called.

## 2018-09-11 NOTE — Procedures (Addendum)
   US guided RLQ paracentesis  9.3 L straw colored fluid Sent for labs per MD Tolerated well  IV Albumin 50 gr per Ursula Beath NP comment on order for para I gave order myself

## 2018-09-15 LAB — PATHOLOGIST SMEAR REVIEW

## 2018-09-16 LAB — CULTURE, BODY FLUID-BOTTLE

## 2018-09-16 LAB — CULTURE, BODY FLUID W GRAM STAIN -BOTTLE: Culture: NO GROWTH

## 2018-09-18 ENCOUNTER — Telehealth (INDEPENDENT_AMBULATORY_CARE_PROVIDER_SITE_OTHER): Payer: Self-pay | Admitting: Internal Medicine

## 2018-09-18 ENCOUNTER — Other Ambulatory Visit (INDEPENDENT_AMBULATORY_CARE_PROVIDER_SITE_OTHER): Payer: Self-pay | Admitting: *Deleted

## 2018-09-18 ENCOUNTER — Encounter (INDEPENDENT_AMBULATORY_CARE_PROVIDER_SITE_OTHER): Payer: Self-pay | Admitting: Internal Medicine

## 2018-09-18 ENCOUNTER — Ambulatory Visit (INDEPENDENT_AMBULATORY_CARE_PROVIDER_SITE_OTHER): Payer: Medicare Other | Admitting: Internal Medicine

## 2018-09-18 VITALS — BP 120/62 | HR 65 | Temp 97.5°F | Ht 69.0 in | Wt 238.1 lb

## 2018-09-18 DIAGNOSIS — K2100 Gastro-esophageal reflux disease with esophagitis, without bleeding: Secondary | ICD-10-CM

## 2018-09-18 DIAGNOSIS — R188 Other ascites: Secondary | ICD-10-CM

## 2018-09-18 DIAGNOSIS — K7460 Unspecified cirrhosis of liver: Secondary | ICD-10-CM

## 2018-09-18 DIAGNOSIS — K760 Fatty (change of) liver, not elsewhere classified: Secondary | ICD-10-CM

## 2018-09-18 DIAGNOSIS — K7469 Other cirrhosis of liver: Secondary | ICD-10-CM

## 2018-09-18 NOTE — Telephone Encounter (Signed)
Please let wife know I have ordered

## 2018-09-18 NOTE — Progress Notes (Signed)
Subjective:    Patient ID: Larry Hayes, male    DOB: 01-27-1946, 72 y.o.   MRN: [id number]  HPI Here today for f/u. Last seen in July of this year. Hx of liver cirrhosis secondary to NASH. He is scheduled for an paracentesis tomorrow. Last US paracentesis was 09/11/2018 with removal of 9.3 liters. Negative for SBP. Screening for esophageal varices. Normal esophagus and stomach. Portal  Hypertensive gastropathy  Last weight in April was 268. Today his weight is 238.1. Says his appetite is not good.  He is having 4-5 BMs a day. He says he is having some confusion.  Had blood work drawn one week ago. Will try to find results.  He cannot tell me today if he has started the Xifaxan. 06/13/2018 Hemoglobin 8.8.  07/31/2018 Ammonia 79. Review of Systems Past Medical History:  Diagnosis Date  . Anemia   . Anxiety   . Arthritis   . Diabetes ([facility])   . GERD (gastroesophageal reflux disease)   . 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 High cholesterol   . HOH (hard of hearing)   . Hypertension   . Sleep apnea   . Thrombocytopenia ([facility]) 08/21/2016    Past Surgical History:  Procedure Laterality Date  . CATARACT EXTRACTION Right 11/2016   also had left eye done  . ESOPHAGEAL BANDING N/A 04/07/2018   Procedure: ESOPHAGEAL BANDING;  Surgeon: Malissa Hippo, MD;  [facility];  Service: Endoscopy;  Laterality: N/A;  . ESOPHAGOGASTRODUODENOSCOPY N/A 08/01/2016   Procedure: ESOPHAGOGASTRODUODENOSCOPY (EGD);  Surgeon: Malissa Hippo, MD;  [facility];  Service: Endoscopy;  Laterality: N/A;  2:45  . ESOPHAGOGASTRODUODENOSCOPY (EGD) WITH PROPOFOL N/A 04/07/2018   Procedure: ESOPHAGOGASTRODUODENOSCOPY (EGD) WITH PROPOFOL;  Surgeon: Malissa Hippo, MD;  [facility];  Service: Endoscopy;  Laterality: N/A;  . HERNIA REPAIR     bilateral inguinal age 71  . KNEE SURGERY     rt arthroscopy    No Known Allergies  Current Outpatient Medications on File Prior to Visit    Medication Sig Dispense Refill  . aspirin EC 81 MG tablet Take 81 mg by mouth daily.    Marland Kitchen atorvastatin (LIPITOR) 80 MG tablet Take 80 mg by mouth at bedtime.    . furosemide (LASIX) 40 MG tablet Take 1 tablet (40 mg total) by mouth daily. 90 tablet 3  . insulin glargine (LANTUS) 100 UNIT/ML injection Inject 40 Units into the skin at bedtime.     . isosorbide dinitrate (ISORDIL) 30 MG tablet Take 30 mg by mouth 4 (four) times daily.    Marland Kitchen lactulose (CHRONULAC) 10 GM/15ML solution Take 15 mLs (10 g total) by mouth 2 (two) times daily. 946 mL 11  . lisinopril (PRINIVIL,ZESTRIL) 5 MG tablet Take 5 mg by mouth daily.    . metFORMIN (GLUCOPHAGE) 1000 MG tablet Take 1,000 mg by mouth 2 (two) times daily with a meal.     . metoprolol succinate (TOPROL-XL) 25 MG 24 hr tablet Take 25 mg by mouth daily.    . Multiple Vitamin (STRESS B PO) Take 1 tablet by mouth daily.    . Multiple Vitamins-Minerals (MULTIVITAMIN PO) Take 1 tablet by mouth daily.    Marland Kitchen omeprazole (PRILOSEC) 20 MG capsule Take 1 capsule (20 mg total) by mouth daily. 90 capsule 3  . sertraline (ZOLOFT) 50 MG tablet Take 50 mg by mouth daily.     Marland Kitchen spironolactone (ALDACTONE) 50 MG tablet Take 2 tablets (100 mg  total) by mouth daily. 60 tablet 5   No current facility-administered medications on file prior to visit.         Objective:   Physical Exam Blood pressure 120/62, pulse 65, temperature (!) 97.5 F (36.4 C), height 5\' 9"  (1.753 m), weight 238 lb 1.6 oz (108 kg). Alert and oriented. Skin warm and dry. Oral mucosa is moist.   . Sclera anicteric, conjunctivae is pink. Thyroid not enlarged. No cervical lymphadenopathy. Lungs clear. Heart regular rate and rhythm.  Abdomen is soft. Bowel sounds are positive. No hepatomegaly. No abdominal masses felt. No tenderness.  1+ edema to lower extremities.  Abdomen is distended. Ascites present.         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  Scheduled for  paracentesis tomorrow.  GERD. He will continue the Omeprazole Thrombocytopenia related to his liver disease.  OV in 3 months.  H and H in 2 weeks.

## 2018-09-18 NOTE — Patient Instructions (Signed)
Keep appt with [name] tomorrow.

## 2018-09-18 NOTE — Telephone Encounter (Signed)
US paracentesis ordered 

## 2018-09-18 NOTE — Telephone Encounter (Signed)
[name] para sch'd 09/19/18 at 1100 ([phone]), patient aware

## 2018-09-18 NOTE — Telephone Encounter (Signed)
Wife called stated patient needs fluid drawn off - please call [phone]

## 2018-09-19 ENCOUNTER — Ambulatory Visit (HOSPITAL_COMMUNITY)
Admission: RE | Admit: 2018-09-19 | Discharge: 2018-09-19 | Disposition: A | Discharge status: Home / Self Care | Payer: Medicare Other | Source: Ambulatory Visit | Attending: Internal Medicine | Admitting: Internal Medicine

## 2018-09-19 DIAGNOSIS — K7460 Unspecified cirrhosis of liver: Secondary | ICD-10-CM | POA: Insufficient documentation

## 2018-09-19 DIAGNOSIS — R188 Other ascites: Secondary | ICD-10-CM | POA: Insufficient documentation

## 2018-09-19 LAB — BODY FLUID CELL COUNT WITH DIFFERENTIAL
EOS FL: 0 %
Lymphs, Fluid: 69 %
Monocyte-Macrophage-Serous Fluid: 28 % — ABNORMAL LOW (ref 50–90)
Neutrophil Count, Fluid: 3 % (ref 0–25)
OTHER CELLS FL: 1 %
WBC FLUID: 99 uL (ref 0–1000)

## 2018-09-19 LAB — GRAM STAIN: GRAM STAIN: NONE SEEN

## 2018-09-19 MED ORDER — ALBUMIN HUMAN 25 % IV SOLN
INTRAVENOUS | Status: AC
  Administered 2018-09-19: 11:00:00
  Filled 2018-09-19: qty 50

## 2018-09-19 MED ORDER — ALBUMIN HUMAN 25 % IV SOLN
INTRAVENOUS | Status: AC
  Administered 2018-09-19: 12.5 g
  Filled 2018-09-19: qty 50

## 2018-09-19 NOTE — Progress Notes (Addendum)
Pulled 7.6 liters of yellow fluid from right lower quadrant during the paracentesis. Patient's vital signs stable throughout. This RN stayed with patient throughout the procedure.

## 2018-09-19 NOTE — Procedures (Signed)
PreOperative Dx: Cirrhosis, ascites Postoperative Dx: Cirrhosis, ascites Procedure:   US guided paracentesis Radiologist:  Rashika Bettes Anesthesia:  10 ml of1% lidocaine Specimen:  7.6 L of yellow ascitic fluid EBL:   < 1 ml Complications: None  

## 2018-09-22 LAB — PATHOLOGIST SMEAR REVIEW

## 2018-09-24 LAB — CULTURE, BODY FLUID W GRAM STAIN -BOTTLE

## 2018-09-24 LAB — CULTURE, BODY FLUID-BOTTLE: CULTURE: NO GROWTH

## 2018-10-01 ENCOUNTER — Telehealth (INDEPENDENT_AMBULATORY_CARE_PROVIDER_SITE_OTHER): Payer: Self-pay | Admitting: Internal Medicine

## 2018-10-01 DIAGNOSIS — R188 Other ascites: Secondary | ICD-10-CM

## 2018-10-01 NOTE — Telephone Encounter (Signed)
US paracentesis ordered

## 2018-10-01 NOTE — Telephone Encounter (Signed)
US paracentesis ordered 

## 2018-10-01 NOTE — Telephone Encounter (Signed)
Patient called stated he needs fluid drawn off

## 2018-10-01 NOTE — Telephone Encounter (Signed)
Larry Hayes, US paracentesis

## 2018-10-02 NOTE — Telephone Encounter (Signed)
US paracentesis sch'd 10/03/18 at 9 (845), patiwent aware

## 2018-10-03 ENCOUNTER — Encounter (HOSPITAL_COMMUNITY): Payer: Self-pay

## 2018-10-03 ENCOUNTER — Ambulatory Visit (HOSPITAL_COMMUNITY)
Admission: RE | Admit: 2018-10-03 | Discharge: 2018-10-03 | Disposition: A | Discharge status: Home / Self Care | Payer: Medicare Other | Source: Ambulatory Visit | Attending: Internal Medicine | Admitting: Internal Medicine

## 2018-10-03 DIAGNOSIS — R188 Other ascites: Secondary | ICD-10-CM | POA: Diagnosis present

## 2018-10-03 DIAGNOSIS — K7460 Unspecified cirrhosis of liver: Secondary | ICD-10-CM | POA: Diagnosis present

## 2018-10-03 LAB — GLUCOSE, PLEURAL OR PERITONEAL FLUID: Glucose, Fluid: 165 mg/dL

## 2018-10-03 LAB — BODY FLUID CELL COUNT WITH DIFFERENTIAL
EOS FL: 0 %
Lymphs, Fluid: 67 %
Monocyte-Macrophage-Serous Fluid: 29 % — ABNORMAL LOW (ref 50–90)
Neutrophil Count, Fluid: 4 % (ref 0–25)
OTHER CELLS FL: 1 %
Total Nucleated Cell Count, Fluid: 114 cu mm (ref 0–1000)

## 2018-10-03 LAB — GRAM STAIN

## 2018-10-03 MED ORDER — ALBUMIN HUMAN 25 % IV SOLN
INTRAVENOUS | Status: AC
  Administered 2018-10-03: 50 g via INTRAVENOUS
  Filled 2018-10-03: qty 200

## 2018-10-03 MED ORDER — ALBUMIN HUMAN 25 % IV SOLN
50.0000 g | Freq: Once | INTRAVENOUS | Status: AC
  Administered 2018-10-03: 50 g via INTRAVENOUS

## 2018-10-03 NOTE — Progress Notes (Signed)
Paracentesis complete no signs of distress.  

## 2018-10-03 NOTE — Procedures (Signed)
PreOperative Dx: Cirrhosis, ascites Postoperative Dx: Cirrhosis, ascites Procedure:   US guided paracentesis Radiologist:  Shaakira Borrero Anesthesia:  10 ml of1% lidocaine Specimen:  8.5 L of yellow ascitic fluid EBL:   < 1 ml Complications: None  

## 2018-10-06 ENCOUNTER — Encounter (INDEPENDENT_AMBULATORY_CARE_PROVIDER_SITE_OTHER): Payer: Self-pay | Admitting: *Deleted

## 2018-10-06 ENCOUNTER — Other Ambulatory Visit (INDEPENDENT_AMBULATORY_CARE_PROVIDER_SITE_OTHER): Payer: Self-pay | Admitting: *Deleted

## 2018-10-06 DIAGNOSIS — R188 Other ascites: Secondary | ICD-10-CM

## 2018-10-06 DIAGNOSIS — K760 Fatty (change of) liver, not elsewhere classified: Secondary | ICD-10-CM

## 2018-10-06 DIAGNOSIS — K7469 Other cirrhosis of liver: Secondary | ICD-10-CM

## 2018-10-07 LAB — PATHOLOGIST SMEAR REVIEW

## 2018-10-08 ENCOUNTER — Other Ambulatory Visit: Payer: Self-pay

## 2018-10-08 ENCOUNTER — Emergency Department (HOSPITAL_COMMUNITY)
Admission: EM | Admit: 2018-10-08 | Discharge: 2018-10-08 | Disposition: A | Discharge status: Home / Self Care | Payer: Medicare Other | Attending: Emergency Medicine | Admitting: Emergency Medicine

## 2018-10-08 ENCOUNTER — Encounter (HOSPITAL_COMMUNITY): Payer: Self-pay | Admitting: Emergency Medicine

## 2018-10-08 DIAGNOSIS — K9189 Other postprocedural complications and disorders of digestive system: Secondary | ICD-10-CM | POA: Diagnosis present

## 2018-10-08 DIAGNOSIS — E119 Type 2 diabetes mellitus without complications: Secondary | ICD-10-CM | POA: Diagnosis not present

## 2018-10-08 DIAGNOSIS — Z794 Long term (current) use of insulin: Secondary | ICD-10-CM | POA: Diagnosis not present

## 2018-10-08 DIAGNOSIS — Z7982 Long term (current) use of aspirin: Secondary | ICD-10-CM | POA: Insufficient documentation

## 2018-10-08 DIAGNOSIS — Z87891 Personal history of nicotine dependence: Secondary | ICD-10-CM | POA: Insufficient documentation

## 2018-10-08 DIAGNOSIS — E7800 Pure hypercholesterolemia, unspecified: Secondary | ICD-10-CM | POA: Insufficient documentation

## 2018-10-08 DIAGNOSIS — Z9889 Other specified postprocedural states: Secondary | ICD-10-CM

## 2018-10-08 DIAGNOSIS — I10 Essential (primary) hypertension: Secondary | ICD-10-CM | POA: Insufficient documentation

## 2018-10-08 DIAGNOSIS — N3945 Continuous leakage: Secondary | ICD-10-CM

## 2018-10-08 DIAGNOSIS — Z79899 Other long term (current) drug therapy: Secondary | ICD-10-CM | POA: Diagnosis not present

## 2018-10-08 LAB — CULTURE, BODY FLUID W GRAM STAIN -BOTTLE: Culture: NO GROWTH

## 2018-10-08 LAB — CULTURE, BODY FLUID-BOTTLE

## 2018-10-08 MED ORDER — LIDOCAINE HCL (PF) 1 % IJ SOLN
5.0000 mL | Freq: Once | INTRAMUSCULAR | Status: DC
  Filled 2018-10-08: qty 6

## 2018-10-08 NOTE — ED Triage Notes (Signed)
PT states he had paracentesis performed at [facility] on 10/03/18. PT states on 10/04/18 the area where the procedure was performed started leaking clear fluid. Clear fluid noted to be coming out of the area in triage in small amounts. PT denies any pain.

## 2018-10-08 NOTE — ED Provider Notes (Signed)
[facility] EMERGENCY DEPARTMENT Provider Note   CSN: [id number] Arrival date & time: 10/08/18  1644     History   Chief Complaint Chief Complaint  Patient presents with  . Post-op Problem    HPI Larry Hayes is a 72 y.o. male.  Patient is a 72 year old male who presents to the emergency department because of leaking from the paracentesis site.  The patient had an ultrasound-guided paracentesis on October 25.  The patient states since that time he has been having some leak of clear fluid from the puncture site.  He is applied dressings, but this is not been successful with stopping the leak.  No recent fever or chills.  No red streaks from the paracentesis site.  No pus like drainage noted.  He presents now for assistance with this issue.  The history is provided by the patient.    Past Medical History:  Diagnosis Date  . Anemia   . Anxiety   . Arthritis   . Diabetes (HCC)   . GERD (gastroesophageal reflux disease)   . 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 High cholesterol   . HOH (hard of hearing)   . Hypertension   . Sleep apnea   . Thrombocytopenia (HCC) 08/21/2016    Patient Active Problem List   Diagnosis Date Noted  . Other cirrhosis of liver (HCC) 04/01/2018  . Other ascites 04/01/2018  . 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 Thrombocytopenia (HCC) 08/21/2016  . Iron deficiency anemia due to chronic blood loss 07/31/2016  . Guaiac positive stools 07/31/2016  . Diabetes (HCC) 05/23/2016  . High cholesterol 05/23/2016  . Anemia 05/23/2016    Past Surgical History:  Procedure Laterality Date  . CATARACT EXTRACTION Right 11/2016   also had left eye done  . ESOPHAGEAL BANDING N/A 04/07/2018   Procedure: ESOPHAGEAL BANDING;  Surgeon: Malissa Hippo, MD;  Location: [facility];  Service: Endoscopy;  Laterality: N/A;  . ESOPHAGOGASTRODUODENOSCOPY N/A 08/01/2016   Procedure: ESOPHAGOGASTRODUODENOSCOPY (EGD);  Surgeon: Malissa Hippo, MD;  Location: [facility] ENDO  SUITE;  Service: Endoscopy;  Laterality: N/A;  2:45  . ESOPHAGOGASTRODUODENOSCOPY (EGD) WITH PROPOFOL N/A 04/07/2018   Procedure: ESOPHAGOGASTRODUODENOSCOPY (EGD) WITH PROPOFOL;  Surgeon: Malissa Hippo, MD;  Location: [facility];  Service: Endoscopy;  Laterality: N/A;  . HERNIA REPAIR     bilateral inguinal age 32  . KNEE SURGERY     rt arthroscopy        Home Medications    Prior to Admission medications   Medication Sig Start Date End Date Taking? Authorizing Provider  aspirin EC 81 MG tablet Take 81 mg by mouth daily.    [provider]  atorvastatin (LIPITOR) 80 MG tablet Take 80 mg by mouth at bedtime.    [provider]  furosemide (LASIX) 40 MG tablet Take 1 tablet (40 mg total) by mouth daily. 07/03/18   Setzer, Brand Males, NP  insulin glargine (LANTUS) 100 UNIT/ML injection Inject 40 Units into the skin at bedtime.     [provider]  isosorbide dinitrate (ISORDIL) 30 MG tablet Take 30 mg by mouth 4 (four) times daily.    [provider]  lactulose (CHRONULAC) 10 GM/15ML solution Take 15 mLs (10 g total) by mouth 2 (two) times daily. 04/01/18   Rehman, Joline Maxcy, MD  lisinopril (PRINIVIL,ZESTRIL) 5 MG tablet Take 5 mg by mouth daily.    [provider]  metFORMIN (GLUCOPHAGE) 1000 MG tablet Take 1,000 mg by mouth 2 (  two) times daily with a meal.     [provider]  metoprolol succinate (TOPROL-XL) 25 MG 24 hr tablet Take 25 mg by mouth daily.    [provider]  Multiple Vitamin (STRESS B PO) Take 1 tablet by mouth daily.    [provider]  Multiple Vitamins-Minerals (MULTIVITAMIN PO) Take 1 tablet by mouth daily.    [provider]  omeprazole (PRILOSEC) 20 MG capsule Take 1 capsule (20 mg total) by mouth daily. 04/01/18   Rehman, Joline Maxcy, MD  sertraline (ZOLOFT) 50 MG tablet Take 50 mg by mouth daily.  07/12/17   [provider]  spironolactone (ALDACTONE) 50 MG tablet Take 2 tablets (100  mg total) by mouth daily. 10/03/17   Malissa Hippo, MD    Family History History reviewed. No pertinent family history.  Social History Social History   Tobacco Use  . Smoking status: Former Smoker    Packs/day: 1.50    Types: Cigarettes    Last attempt to quit: 05/23/2001    Years since quitting: 17.3  . Smokeless tobacco: Never Used  Substance Use Topics  . Alcohol use: No    Alcohol/week: 0.0 standard drinks  . Drug use: No     Allergies   Patient has no known allergies.   Review of Systems Review of Systems  Constitutional: Negative for activity change.       All ROS Neg except as noted in HPI  HENT: Negative for nosebleeds.   Eyes: Negative for photophobia and discharge.  Respiratory: Negative for cough, shortness of breath and wheezing.   Cardiovascular: Negative for chest pain and palpitations.  Gastrointestinal:       Abdominal swelling  Genitourinary: Negative for dysuria, frequency and hematuria.  Musculoskeletal: Positive for arthralgias. Negative for back pain and neck pain.  Skin: Negative.   Neurological: Negative for dizziness, seizures and speech difficulty.  Psychiatric/Behavioral: Negative for confusion and hallucinations.     Physical Exam Updated Vital Signs BP (!) 100/52 (BP Location: Right Arm)   Pulse (!) 55   Temp (!) 97.5 F (36.4 C) (Oral)   Resp 18   Ht 5\' 9"  (1.753 m)   Wt 105.2 kg   SpO2 98%   BMI 34.26 kg/m   Physical Exam  Constitutional: He is oriented to person, place, and time. He appears well-developed and well-nourished.  Non-toxic appearance.  HENT:  Head: Normocephalic.  Right Ear: Tympanic membrane and external ear normal.  Left Ear: Tympanic membrane and external ear normal.  Eyes: Pupils are equal, round, and reactive to light. EOM and lids are normal.  Neck: Normal range of motion. Neck supple. Carotid bruit is not present.  Cardiovascular: Regular rhythm, normal heart sounds, intact distal pulses and normal  pulses. Bradycardia present.  Pulmonary/Chest: Breath sounds normal. No respiratory distress.  Abdominal: Soft. Bowel sounds are normal. There is no tenderness. There is no guarding.  Mild to moderate ascites noted of the abdomen.  Patient has a slow leak at the puncture site at the right mid lateral abdomen.  No red streaks appreciated.  No hot areas appreciated.  No pus like drainage.  Musculoskeletal: Normal range of motion.  Lymphadenopathy:       Head (right side): No submandibular adenopathy present.       Head (left side): No submandibular adenopathy present.    He has no cervical adenopathy.  Neurological: He is alert and oriented to person, place, and time. He has normal strength. No cranial  nerve deficit or sensory deficit.  Skin: Skin is warm and dry.  Psychiatric: He has a normal mood and affect. His speech is normal.  Nursing note and vitals reviewed.    ED Treatments / Results  Labs (all labs ordered are listed, but only abnormal results are displayed) Labs Reviewed - No data to display  EKG None  Radiology No results found.  Procedures Procedures (including critical care time) PARACENTESIS LEAK REPAIR. Patient states that he had an ultrasound-guided paracentesis on October 25.  He is been having leaking of a clear fluid since that time.  I discussed with the patient the procedure to help stop the leak.  Questions were answered.  Patient gives permission for the procedure.  Patient identified by armband.  Procedural timeout taken.  The area was cleansed with alcohol x2.  Following this the paracentesis leak site was infiltrated with 1% plain lidocaine.  A figure of 8 stitch with 4-0 Vicryl rapide was placed.  Following this the area was covered with multiple layers of Dermabond.  The site was then observed, and remains dry.  Patient tolerated the procedure without problem.  Medications Ordered in ED Medications  lidocaine (PF) (XYLOCAINE) 1 % injection 5 mL  (has no administration in time range)     Initial Impression / Assessment and Plan / ED Course  I have reviewed the triage vital signs and the nursing notes.  Pertinent labs & imaging results that were available during my care of the patient were reviewed by me and considered in my medical decision making (see chart for details).       Final Clinical Impressions(s) / ED Diagnoses Pt seen with me by Dr Fleet Contras. MDM  Vital signs reviewed.  Pulse oximetry is 98% on room air.  There is a continuous leak of clear fluid from the paracentesis puncture area.  No hematoma is noted around the area.  No red streaks, or signs of infection.  The leak was repaired with a figure-of-eight stitch, followed by Dermabond.  The patient was observed, and no further leaking appreciated.  I have asked the patient to follow-up with his primary physician.  Have asked him to return to the emergency department if the leaking should start back up again.  Patient is in agreement with this plan.   Final diagnoses:  Continuous leakage  History of abdominal paracentesis    ED Discharge Orders    None       Ivery Quale, PA-C 10/08/18 2308    Eber Hong, MD 10/09/18 [phone]

## 2018-10-08 NOTE — Discharge Instructions (Addendum)
Your paracentesis leak was repaired with a dissolvable stitch and Dermabond skin glue.  Both of these will come off on their own and about 7 to 10 days.  Please do not apply any lotion, petroleum jelly products or Neosporin type products on the glue.  Please see your primary physician or return to the emergency department if this should start to leak again.

## 2018-10-08 NOTE — ED Notes (Signed)
No answer when called 

## 2018-10-15 ENCOUNTER — Inpatient Hospital Stay (HOSPITAL_COMMUNITY)
Admission: AD | Admit: 2018-10-15 | Discharge: 2018-10-23 | DRG: 682 | Disposition: A | Discharge status: Hospice | Payer: Medicare Other | Source: Other Acute Inpatient Hospital | Attending: Family Medicine | Admitting: Family Medicine

## 2018-10-15 DIAGNOSIS — Z9842 Cataract extraction status, left eye: Secondary | ICD-10-CM

## 2018-10-15 DIAGNOSIS — K7460 Unspecified cirrhosis of liver: Secondary | ICD-10-CM | POA: Diagnosis not present

## 2018-10-15 DIAGNOSIS — K529 Noninfective gastroenteritis and colitis, unspecified: Secondary | ICD-10-CM | POA: Diagnosis present

## 2018-10-15 DIAGNOSIS — D696 Thrombocytopenia, unspecified: Secondary | ICD-10-CM | POA: Diagnosis present

## 2018-10-15 DIAGNOSIS — N179 Acute kidney failure, unspecified: Secondary | ICD-10-CM | POA: Diagnosis not present

## 2018-10-15 DIAGNOSIS — R188 Other ascites: Secondary | ICD-10-CM | POA: Diagnosis present

## 2018-10-15 DIAGNOSIS — D638 Anemia in other chronic diseases classified elsewhere: Secondary | ICD-10-CM | POA: Diagnosis not present

## 2018-10-15 DIAGNOSIS — N183 Chronic kidney disease, stage 3 (moderate): Secondary | ICD-10-CM | POA: Diagnosis present

## 2018-10-15 DIAGNOSIS — K219 Gastro-esophageal reflux disease without esophagitis: Secondary | ICD-10-CM | POA: Diagnosis not present

## 2018-10-15 DIAGNOSIS — E1122 Type 2 diabetes mellitus with diabetic chronic kidney disease: Secondary | ICD-10-CM | POA: Diagnosis not present

## 2018-10-15 DIAGNOSIS — Z66 Do not resuscitate: Secondary | ICD-10-CM | POA: Diagnosis not present

## 2018-10-15 DIAGNOSIS — K766 Portal hypertension: Secondary | ICD-10-CM | POA: Diagnosis present

## 2018-10-15 DIAGNOSIS — K7581 Nonalcoholic steatohepatitis (NASH): Secondary | ICD-10-CM | POA: Diagnosis present

## 2018-10-15 DIAGNOSIS — G4730 Sleep apnea, unspecified: Secondary | ICD-10-CM | POA: Diagnosis not present

## 2018-10-15 DIAGNOSIS — Z794 Long term (current) use of insulin: Secondary | ICD-10-CM | POA: Diagnosis not present

## 2018-10-15 DIAGNOSIS — R5381 Other malaise: Secondary | ICD-10-CM | POA: Diagnosis present

## 2018-10-15 DIAGNOSIS — F419 Anxiety disorder, unspecified: Secondary | ICD-10-CM | POA: Diagnosis present

## 2018-10-15 DIAGNOSIS — Z515 Encounter for palliative care: Secondary | ICD-10-CM

## 2018-10-15 DIAGNOSIS — N170 Acute kidney failure with tubular necrosis: Secondary | ICD-10-CM | POA: Diagnosis not present

## 2018-10-15 DIAGNOSIS — E872 Acidosis, unspecified: Secondary | ICD-10-CM | POA: Diagnosis present

## 2018-10-15 DIAGNOSIS — D509 Iron deficiency anemia, unspecified: Secondary | ICD-10-CM | POA: Diagnosis not present

## 2018-10-15 DIAGNOSIS — Z7189 Other specified counseling: Secondary | ICD-10-CM

## 2018-10-15 DIAGNOSIS — N200 Calculus of kidney: Secondary | ICD-10-CM | POA: Diagnosis not present

## 2018-10-15 DIAGNOSIS — Z87891 Personal history of nicotine dependence: Secondary | ICD-10-CM

## 2018-10-15 DIAGNOSIS — Z9841 Cataract extraction status, right eye: Secondary | ICD-10-CM

## 2018-10-15 DIAGNOSIS — R161 Splenomegaly, not elsewhere classified: Secondary | ICD-10-CM | POA: Diagnosis present

## 2018-10-15 DIAGNOSIS — E875 Hyperkalemia: Secondary | ICD-10-CM | POA: Diagnosis present

## 2018-10-15 DIAGNOSIS — I864 Gastric varices: Secondary | ICD-10-CM | POA: Diagnosis not present

## 2018-10-15 DIAGNOSIS — Z8349 Family history of other endocrine, nutritional and metabolic diseases: Secondary | ICD-10-CM

## 2018-10-15 DIAGNOSIS — Z6834 Body mass index (BMI) 34.0-34.9, adult: Secondary | ICD-10-CM

## 2018-10-15 DIAGNOSIS — E119 Type 2 diabetes mellitus without complications: Secondary | ICD-10-CM | POA: Diagnosis not present

## 2018-10-15 DIAGNOSIS — D6959 Other secondary thrombocytopenia: Secondary | ICD-10-CM | POA: Diagnosis present

## 2018-10-15 DIAGNOSIS — D539 Nutritional anemia, unspecified: Secondary | ICD-10-CM | POA: Diagnosis present

## 2018-10-15 DIAGNOSIS — K767 Hepatorenal syndrome: Secondary | ICD-10-CM | POA: Diagnosis present

## 2018-10-15 DIAGNOSIS — I129 Hypertensive chronic kidney disease with stage 1 through stage 4 chronic kidney disease, or unspecified chronic kidney disease: Secondary | ICD-10-CM | POA: Diagnosis present

## 2018-10-15 DIAGNOSIS — Z79899 Other long term (current) drug therapy: Secondary | ICD-10-CM

## 2018-10-15 DIAGNOSIS — E785 Hyperlipidemia, unspecified: Secondary | ICD-10-CM | POA: Diagnosis not present

## 2018-10-15 DIAGNOSIS — K7031 Alcoholic cirrhosis of liver with ascites: Secondary | ICD-10-CM | POA: Diagnosis not present

## 2018-10-15 DIAGNOSIS — R630 Anorexia: Secondary | ICD-10-CM | POA: Diagnosis present

## 2018-10-15 DIAGNOSIS — R8281 Pyuria: Secondary | ICD-10-CM | POA: Diagnosis present

## 2018-10-15 DIAGNOSIS — Z7982 Long term (current) use of aspirin: Secondary | ICD-10-CM

## 2018-10-16 ENCOUNTER — Other Ambulatory Visit: Payer: Self-pay

## 2018-10-16 ENCOUNTER — Encounter (HOSPITAL_COMMUNITY): Payer: Self-pay | Admitting: Family Medicine

## 2018-10-16 DIAGNOSIS — E872 Acidosis, unspecified: Secondary | ICD-10-CM | POA: Diagnosis present

## 2018-10-16 DIAGNOSIS — R188 Other ascites: Secondary | ICD-10-CM

## 2018-10-16 DIAGNOSIS — E875 Hyperkalemia: Secondary | ICD-10-CM | POA: Insufficient documentation

## 2018-10-16 DIAGNOSIS — N179 Acute kidney failure, unspecified: Secondary | ICD-10-CM | POA: Diagnosis present

## 2018-10-16 LAB — CBC WITH DIFFERENTIAL/PLATELET
Abs Immature Granulocytes: 0.01 10*3/uL (ref 0.00–0.07)
Basophils Absolute: 0 10*3/uL (ref 0.0–0.1)
Basophils Relative: 0 %
EOS ABS: 0.1 10*3/uL (ref 0.0–0.5)
Eosinophils Relative: 2 %
HEMATOCRIT: 26.2 % — AB (ref 39.0–52.0)
Hemoglobin: 8.2 g/dL — ABNORMAL LOW (ref 13.0–17.0)
Immature Granulocytes: 0 %
LYMPHS ABS: 0.5 10*3/uL — AB (ref 0.7–4.0)
Lymphocytes Relative: 12 %
MCH: 31.5 pg (ref 26.0–34.0)
MCHC: 31.3 g/dL (ref 30.0–36.0)
MCV: 100.8 fL — AB (ref 80.0–100.0)
MONO ABS: 0.4 10*3/uL (ref 0.1–1.0)
MONOS PCT: 10 %
Neutro Abs: 3.2 10*3/uL (ref 1.7–7.7)
Neutrophils Relative %: 76 %
Platelets: 39 10*3/uL — ABNORMAL LOW (ref 150–400)
RBC: 2.6 MIL/uL — ABNORMAL LOW (ref 4.22–5.81)
RDW: 15.2 % (ref 11.5–15.5)
WBC: 4.3 10*3/uL (ref 4.0–10.5)
nRBC: 0 % (ref 0.0–0.2)

## 2018-10-16 LAB — URINALYSIS, COMPLETE (UACMP) WITH MICROSCOPIC
Bilirubin Urine: NEGATIVE
Glucose, UA: NEGATIVE mg/dL
HGB URINE DIPSTICK: NEGATIVE
Ketones, ur: NEGATIVE mg/dL
Nitrite: NEGATIVE
PROTEIN: NEGATIVE mg/dL
Specific Gravity, Urine: 1.013 (ref 1.005–1.030)
pH: 5 (ref 5.0–8.0)

## 2018-10-16 LAB — GLUCOSE, CAPILLARY
GLUCOSE-CAPILLARY: 208 mg/dL — AB (ref 70–99)
GLUCOSE-CAPILLARY: 155 mg/dL — AB (ref 70–99)
GLUCOSE-CAPILLARY: 189 mg/dL — AB (ref 70–99)
Glucose-Capillary: 136 mg/dL — ABNORMAL HIGH (ref 70–99)
Glucose-Capillary: 151 mg/dL — ABNORMAL HIGH (ref 70–99)

## 2018-10-16 LAB — COMPREHENSIVE METABOLIC PANEL
ALK PHOS: 115 U/L (ref 38–126)
ALT: 36 U/L (ref 0–44)
AST: 62 U/L — ABNORMAL HIGH (ref 15–41)
Albumin: 2.3 g/dL — ABNORMAL LOW (ref 3.5–5.0)
Anion gap: 7 (ref 5–15)
BUN: 71 mg/dL — ABNORMAL HIGH (ref 8–23)
CALCIUM: 8.4 mg/dL — AB (ref 8.9–10.3)
CO2: 13 mmol/L — AB (ref 22–32)
CREATININE: 5.68 mg/dL — AB (ref 0.61–1.24)
Chloride: 117 mmol/L — ABNORMAL HIGH (ref 98–111)
GFR calc Af Amer: 10 mL/min — ABNORMAL LOW (ref >60)
GFR calc non Af Amer: 9 mL/min — ABNORMAL LOW (ref >60)
GLUCOSE: 177 mg/dL — AB (ref 70–99)
Potassium: 5.5 mmol/L — ABNORMAL HIGH (ref 3.5–5.1)
SODIUM: 137 mmol/L (ref 135–145)
Total Bilirubin: 1.1 mg/dL (ref 0.3–1.2)
Total Protein: 5.2 g/dL — ABNORMAL LOW (ref 6.5–8.1)

## 2018-10-16 LAB — PROTIME-INR
INR: 1.69
Prothrombin Time: 19.7 seconds — ABNORMAL HIGH (ref 11.4–15.2)

## 2018-10-16 LAB — SODIUM, URINE, RANDOM

## 2018-10-16 LAB — CREATININE, URINE, RANDOM: CREATININE, URINE: 169.96 mg/dL

## 2018-10-16 MED ORDER — SODIUM CHLORIDE 0.9 % IV SOLN
50.0000 ug/h | INTRAVENOUS | Status: DC
  Administered 2018-10-17 – 2018-10-21 (×9): 50 ug/h via INTRAVENOUS
  Filled 2018-10-16 (×17): qty 1

## 2018-10-16 MED ORDER — INSULIN ASPART 100 UNIT/ML ~~LOC~~ SOLN
0.0000 [IU] | Freq: Every day | SUBCUTANEOUS | Status: DC
  Administered 2018-10-19: 2 [IU] via SUBCUTANEOUS

## 2018-10-16 MED ORDER — SODIUM CHLORIDE 0.9 % IV SOLN: 250.0000 mL | INTRAVENOUS | Status: DC | PRN

## 2018-10-16 MED ORDER — SODIUM BICARBONATE 650 MG PO TABS
650.0000 mg | ORAL_TABLET | Freq: Three times a day (TID) | ORAL | Status: DC
  Administered 2018-10-16 – 2018-10-17 (×6): 650 mg via ORAL
  Filled 2018-10-16 (×6): qty 1

## 2018-10-16 MED ORDER — SODIUM CHLORIDE 0.9 % IV SOLN
INTRAVENOUS | Status: DC
  Administered 2018-10-16: 09:00:00 via INTRAVENOUS

## 2018-10-16 MED ORDER — MIDODRINE HCL 5 MG PO TABS
10.0000 mg | ORAL_TABLET | Freq: Three times a day (TID) | ORAL | Status: DC
  Administered 2018-10-16 – 2018-10-23 (×20): 10 mg via ORAL
  Filled 2018-10-16 (×20): qty 2

## 2018-10-16 MED ORDER — PANTOPRAZOLE SODIUM 40 MG PO TBEC
40.0000 mg | DELAYED_RELEASE_TABLET | Freq: Every day | ORAL | Status: DC
  Administered 2018-10-16 – 2018-10-23 (×8): 40 mg via ORAL
  Filled 2018-10-16 (×7): qty 1

## 2018-10-16 MED ORDER — INSULIN GLARGINE 100 UNIT/ML ~~LOC~~ SOLN
10.0000 [IU] | Freq: Every day | SUBCUTANEOUS | Status: DC
  Administered 2018-10-16 – 2018-10-22 (×7): 10 [IU] via SUBCUTANEOUS
  Filled 2018-10-16 (×9): qty 0.1

## 2018-10-16 MED ORDER — SODIUM CHLORIDE 0.9% FLUSH
3.0000 mL | Freq: Two times a day (BID) | INTRAVENOUS | Status: DC
  Administered 2018-10-16 – 2018-10-20 (×4): 3 mL via INTRAVENOUS

## 2018-10-16 MED ORDER — ALBUMIN HUMAN 5 % IV SOLN
75.0000 g | Freq: Every day | INTRAVENOUS | Status: AC
  Administered 2018-10-16 – 2018-10-17 (×2): 75 g via INTRAVENOUS
  Filled 2018-10-16 (×5): qty 1500

## 2018-10-16 MED ORDER — ISOSORBIDE DINITRATE 10 MG PO TABS: 30.0000 mg | ORAL_TABLET | Freq: Three times a day (TID) | ORAL | Status: DC

## 2018-10-16 MED ORDER — ONDANSETRON HCL 4 MG/2ML IJ SOLN
INTRAMUSCULAR | Status: AC
  Filled 2018-10-16: qty 2

## 2018-10-16 MED ORDER — INSULIN ASPART 100 UNIT/ML ~~LOC~~ SOLN
0.0000 [IU] | Freq: Three times a day (TID) | SUBCUTANEOUS | Status: DC
  Administered 2018-10-16 (×2): 2 [IU] via SUBCUTANEOUS
  Administered 2018-10-16: 1 [IU] via SUBCUTANEOUS
  Administered 2018-10-17: 2 [IU] via SUBCUTANEOUS
  Administered 2018-10-17: 3 [IU] via SUBCUTANEOUS
  Administered 2018-10-18: 2 [IU] via SUBCUTANEOUS
  Administered 2018-10-18: 1 [IU] via SUBCUTANEOUS
  Administered 2018-10-18: 2 [IU] via SUBCUTANEOUS
  Administered 2018-10-19: 1 [IU] via SUBCUTANEOUS
  Administered 2018-10-19 (×2): 2 [IU] via SUBCUTANEOUS
  Administered 2018-10-20: 1 [IU] via SUBCUTANEOUS
  Administered 2018-10-20: 2 [IU] via SUBCUTANEOUS
  Administered 2018-10-20 – 2018-10-21 (×2): 1 [IU] via SUBCUTANEOUS
  Administered 2018-10-21: 2 [IU] via SUBCUTANEOUS

## 2018-10-16 MED ORDER — METOPROLOL SUCCINATE ER 25 MG PO TB24: 25.0000 mg | ORAL_TABLET | Freq: Every day | ORAL | Status: DC

## 2018-10-16 MED ORDER — SODIUM CHLORIDE 0.9% FLUSH
3.0000 mL | INTRAVENOUS | Status: DC | PRN
  Administered 2018-10-19 – 2018-10-20 (×2): 3 mL via INTRAVENOUS
  Filled 2018-10-16 (×2): qty 3

## 2018-10-16 MED ORDER — ONDANSETRON HCL 4 MG/2ML IJ SOLN
4.0000 mg | Freq: Four times a day (QID) | INTRAMUSCULAR | Status: DC | PRN
  Administered 2018-10-16 – 2018-10-20 (×3): 4 mg via INTRAVENOUS
  Filled 2018-10-16 (×2): qty 2

## 2018-10-16 NOTE — Progress Notes (Signed)
Pt admitted by Dr Myna Hidalgo this am, please see his note for detailed H&P.    Larry Hayes is a 72 y.o. 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 Upon arrival to the ED, patient is found to be afebrile, saturating well on room air, and with vitals otherwise normal.  EKG features a sinus rhythm with low voltage QRS.  Chest x-ray features low volume lungs without any acute finding.  CT of the abdomen and pelvis is negative for hydronephrosis or obstructive uropathy, notable for punctate stones in the lower pole the left kidney, cirrhosis with splenomegaly and upper abdominal varices, and massive ascites. He was transferred to [facility] for evaluation of ARF for possible hepatorenal syndrome.    On exam: Pt alert and comfortable. Not in any distress.  CVS s1s2,  Lungs diminished at bases , no wheezing or rhonchi.  Abdomen is soft distended bowel sounds good.  Extremities No pedal edema no cyanosis or clubbing.    Assessment and Plan:   1. ARF,  Suspect hepato renal syndrome , from volume depletion from diarrhea.  - monitor urine output, Fe Na is less than 0.2%, probably pre renal.  - gently hydrate.  - renal consulted.   2. Ascites and liver cirrhosis:  Will request US paracentesis in am.   Hosie Poisson, MD [phone]

## 2018-10-16 NOTE — Consult Note (Addendum)
Larry Hayes Admit Date: 10/15/2018 10/16/2018 Arita Miss Requesting Physician:  Blake Divine MD  Reason for Consult:  AKI HPI:  72 year old male admitted overnight after presenting to [facility] rocking him with renal failure.  Patient in room accompanied by wife and son.  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  Patient was having malaise, weakness, fatigue, nausea and was sent to the emergency room by nephrology.  The patient and family are very unclear about the history.  Reportedly, patient had a CT the abdomen demonstrating large volume ascites, varices, splenomegaly; no renal obstruction or hydronephrosis.  At the outside his creatinine was 5.8, potassium 5.4.  Patient was transferred to [facility].  Here the patient's creatinine is 5.68 with a potassium of 5.5, serum bicarbonate 13, anion gap of 7 not correcting for his hypoalbuminemia.  Hemoglobin 8.2 with platelet count of 39.  Total bilirubin is 1.1 with an INR of 1.69.  Patient states his last large-volume paracentesis was approximately 2 weeks ago when he had 8.5 L removed.  Previously he reports similar procedures every 2 to 4 weeks.  He denies recent acute diarrhea.  No nonsteroidal intake.  He confirms taking the lisinopril.  No recent infections, fever, chills.  Since admission the patient has been hydrated with normal saline.  Diuretics and lisinopril have been held.  Urine analysis reveals no proteinuria, hematuria.  Patient has trace pyuria.  Urine sodium is  undetectable.   Creat (mg/dL)  Date Value  13/07/6577 1.05  10/18/2017 0.95  10/02/2017 0.88  07/23/2017 1.22 (H)  04/02/2017 1.04  12/28/2016 0.88   Creatinine, Ser (mg/dL)  Date Value  46/96/2952 5.68 (H)  ]  ROS NSAIDS: Denies use IV Contrast no identified exposure TMP/SMX no identified exposure Balance of 12 systems is negative w/ exceptions as above  PMH  Past Medical History:  Diagnosis Date  . Anemia   . Anxiety   . Arthritis   . Diabetes (HCC)   . GERD (gastroesophageal reflux disease)   . 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 High cholesterol   . HOH (hard of hearing)   . Hypertension   . Sleep apnea   . Thrombocytopenia (HCC) 08/21/2016   PSH  Past Surgical History:  Procedure Laterality Date  . CATARACT EXTRACTION Right 11/2016   also had left eye done  . ESOPHAGEAL BANDING N/A 04/07/2018   Procedure: ESOPHAGEAL BANDING;  Surgeon: Malissa Hippo, MD;  Location: [facility];  Service: Endoscopy;  Laterality: N/A;  . ESOPHAGOGASTRODUODENOSCOPY N/A 08/01/2016   Procedure: ESOPHAGOGASTRODUODENOSCOPY (EGD);  Surgeon: Malissa Hippo, MD;  Location: [facility];  Service: Endoscopy;  Laterality: N/A;  2:45  . ESOPHAGOGASTRODUODENOSCOPY (EGD) WITH PROPOFOL N/A 04/07/2018   Procedure: ESOPHAGOGASTRODUODENOSCOPY (EGD) WITH PROPOFOL;  Surgeon: Malissa Hippo, MD;  Location: [facility];  Service: Endoscopy;  Laterality: N/A;  . HERNIA REPAIR     bilateral inguinal age 20  . KNEE SURGERY     rt arthroscopy   FH  Family History  Problem Relation Age of Onset  . Obesity Son    SH  reports that he quit smoking about 17 years ago. His  smoking use included cigarettes. He smoked 1.50 packs per day. He has never used smokeless tobacco. He reports that he does not drink alcohol or use drugs. Allergies No Known Allergies Home medications Prior to Admission medications   Medication Sig Start Date End Date Taking? Authorizing Provider  aspirin EC 81 MG tablet  Take 81 mg by mouth daily.   Yes [provider]  atorvastatin (LIPITOR) 80 MG tablet Take 80 mg by mouth at bedtime.   Yes [provider]  furosemide (LASIX) 40 MG tablet Take 1 tablet (40 mg total) by mouth daily. 07/03/18  Yes Setzer, Terri L, NP  insulin glargine (LANTUS) 100 UNIT/ML injection Inject 40 Units into the skin at bedtime.    Yes [provider]  lisinopril (PRINIVIL,ZESTRIL) 5 MG tablet Take 5 mg by mouth daily.   Yes [provider]  metoprolol succinate (TOPROL-XL) 25 MG 24 hr tablet Take 25 mg by mouth daily.   Yes [provider]  Multiple Vitamins-Minerals (MULTIVITAMIN PO) Take 1 tablet by mouth daily.   Yes [provider]  omeprazole (PRILOSEC) 20 MG capsule Take 1 capsule (20 mg total) by mouth daily. 04/01/18  Yes Rehman, Joline Maxcy, MD  spironolactone (ALDACTONE) 50 MG tablet Take 2 tablets (100 mg total) by mouth daily. 10/03/17  Yes Rehman, Joline Maxcy, MD  VELTASSA 16.8 g PACK Take 16.8 g by mouth daily. Dissolve 1 packet in 1/3 cup of water and drink daily. 10/09/18  Yes [provider]  isosorbide dinitrate (ISORDIL) 30 MG tablet Take 30 mg by mouth 4 (four) times daily.    [provider]  lactulose (CHRONULAC) 10 GM/15ML solution Take 15 mLs (10 g total) by mouth 2 (two) times daily. Patient not taking: Reported on 10/16/2018 04/01/18   Malissa Hippo, MD  metFORMIN (GLUCOPHAGE) 1000 MG tablet Take 1,000 mg by mouth 2 (two) times daily with a meal.     [provider]  Multiple Vitamin (STRESS B PO) Take 1 tablet by mouth daily.    [provider]  sertraline (ZOLOFT) 50 MG tablet Take 50 mg by mouth daily.  07/12/17   [provider]    Current Medications Scheduled Meds: . insulin aspart  0-5 Units Subcutaneous QHS  . insulin aspart  0-9 Units Subcutaneous TID WC  . insulin glargine  10 Units Subcutaneous QHS  . pantoprazole  40 mg Oral Daily  . sodium bicarbonate   650 mg Oral TID  . sodium chloride flush  3 mL Intravenous Q12H   Continuous Infusions: . sodium chloride    . sodium chloride 75 mL/hr at 10/16/18 0858   PRN Meds:.sodium chloride, ondansetron (ZOFRAN) IV, sodium chloride flush  CBC Recent Labs  Lab 10/16/18 0516  WBC 4.3  NEUTROABS 3.2  HGB 8.2*  HCT 26.2*  MCV 100.8*  PLT 39*   Basic Metabolic Panel Recent Labs  Lab 10/16/18 0516  NA 137  K 5.5*  CL 117*  CO2 13*  GLUCOSE 177*  BUN 71*  CREATININE 5.68*  CALCIUM 8.4*    Physical Exam  Blood pressure 121/60, pulse 67, temperature 98.1 F (36.7 C), temperature source Oral, resp. rate 20, SpO2 100 %. GEN: Chronically ill-appearing, slow to respond but appropriate ENT: Mild temporal wasting EYES: Sunken eyes, anicteric, EOMI CV: RRR, normal S1 and S2, no rub PULM: Normal work of breathing, ABD: Distended, not taut, not tender SKIN: No rashes or lesions EXT: No significant lower extremity edema NEURO: No asterixis  Assessment 72 year old male presenting with what appears to be acute on chronic if not slowly progressive renal failure.  Urine sodium is undetectable, urine analysis is bland.  Most likely this patient has hepatorenal syndrome, potentially triggered by recent large volume paracentesis and ongoing use of ACE inhibitor.  He also has mild hyperkalemia and metabolic acidosis in the context of cirrhosis with portal hypertension and ascites.  1. AKI, likely some progressive renal disease pre-existing current presentation; presumably HRS 2. Hyperkalemia, mild; on ACE inhibitor as outpatient, previously on spironolactone 3. Metabolic acidosis, likely secondary to 1 4. Cirrhosis with portal hypertension, large volume ascites requiring serial paracentesis; coagulopathy, thrombocytopenia, varices 5. Diabetes mellitus type 2 6. Anemia  Plan 1. Agree with holding all diuretics, continue to hold ACE inhibitor 2. Will provide challenge of albumin 75gm for two days  (no weight obtained yet ) 3. Start octreotide and Midodrine 4. Would not proceed with paracentesis at the current time, likely to exacerbate renal issue 5. Patient is a very marginal candidate for hemodialysis, given current status I am not sure he would benefit; hopefully his renal function will improve 6. Cont NaHCO3 for now 7. Follow K 8. Use Veltassa as needed for K > 5.5 9. Daily weights, Daily Renal Panel, Strict I/Os, Avoid nephrotoxins (NSAIDs, judicious IV Contrast)    Sabra Heck MD [phone] pgr 10/16/2018, 3:11 PM

## 2018-10-16 NOTE — H&P (Signed)
History and Physical    Jabril Pursell NUU:[id number] DOB: [birth year] DOA: 10/15/2018  PCP: Olena Mater, MD   Patient coming from: Home, by way of [facility] [place]   Chief Complaint: Nausea, malaise, sent by nephrologist for ARF   HPI: Larry Hayes is a 72 y.o. 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  Patient reports a couple month history of progressive exertional dyspnea, fatigue, malaise, loss of appetite, nausea with frequent dry heaves, and chronic diarrhea.  He denies any significant abdominal pain and reports that his last paracentesis was 2 weeks ago with ~7 L off at that time.  Denies any fevers or chills.  Denies any melena or hematochezia recently.  Denies hematemesis.  Creatinine was 1.05 in April, reportedly a little over 2 one month ago, and now 5.9 with his nephrologist today, prompting him to be directed to the ED for further evaluation and management of this.  [facility] ED Course: Upon arrival to the ED, patient is found to be afebrile, saturating well on room air, and with vitals otherwise normal.  EKG features a sinus rhythm with low voltage QRS.  Chest x-ray features low volume lungs without any acute finding.  CT of the abdomen and pelvis is negative for hydronephrosis or obstructive uropathy, notable for punctate stones in the lower pole the left kidney, cirrhosis with splenomegaly and upper abdominal varices, and massive ascites.  Chemistry panel features a potassium of 5.4, bicarbonate of 15, BUN 74, and creatinine 5.83.  Albumin is 2.6, AST 67, total protein 5.4, and total bilirubin 1.3.  Ammonia was normal.  CBC features a microcytic anemia with hemoglobin 9.1 and MCV 102.1.  Platelets are down at 54,000.  Patient was  given 2 L normal saline in the ED.  Transfer to [facility] was arranged for further evaluation and management of acute renal with concern for possible hepatorenal syndrome.  Review of Systems:  All other systems reviewed and apart from HPI, are negative.  Past Medical History:  Diagnosis Date  . Anemia   . Anxiety   . Arthritis   . Diabetes ([place])   . GERD (gastroesophageal reflux disease)   . Hepatic cirrhosis ([place]) 08/21/2016  . High cholesterol   . HOH (hard of hearing)   . Hypertension   . Sleep apnea   . Thrombocytopenia ([place]) 08/21/2016    Past Surgical History:  Procedure Laterality Date  . CATARACT EXTRACTION Right 11/2016   also had left eye done  . ESOPHAGEAL BANDING N/A 04/07/2018   Procedure: ESOPHAGEAL BANDING;  Surgeon: Rogene Houston, MD;  Location: [facility];  Service: Endoscopy;  Laterality: N/A;  . ESOPHAGOGASTRODUODENOSCOPY N/A 08/01/2016   Procedure: ESOPHAGOGASTRODUODENOSCOPY (EGD);  Surgeon: Rogene Houston, MD;  Location: [facility] SU[facility];  Service: Endoscopy;  Laterality: N/A;  2:45  . ESOPHAGOGASTRODUODENOSCOPY (EGD) WITH PROPOFOL N/A 04/07/2018   Procedure: ESOPHAGOGASTRODUODENOSCOPY (EGD) WITH PROPOFOL;  Surgeon: Rogene Houston, MD;  Location: [facility];  Service: Endoscopy;  Laterality: N/A;  . HERNIA REPAIR     bilateral inguinal age 29  . KNEE SURGERY     rt arthroscopy     reports that he quit smoking about 17 years ago. His smoking use included cigarettes. He smoked 1.50 packs per day. He has never used smokeless tobacco. He reports that  he does not drink alcohol or use drugs.  No Known Allergies  Family History  Problem Relation Age of Onset  . Obesity Son      Prior to Admission medications   Medication Sig Start Date End Date Taking? Authorizing Provider  aspirin EC 81 MG tablet Take 81 mg by mouth daily.    [provider]  atorvastatin (LIPITOR) 80 MG tablet Take 80 mg by mouth at bedtime.    [provider]  furosemide (LASIX) 40 MG tablet Take 1 tablet (40 mg total) by mouth daily. 07/03/18   Setzer, Rona Ravens, NP  insulin glargine (LANTUS) 100 UNIT/ML injection Inject 40 Units into the skin at bedtime.     [provider]  isosorbide dinitrate (ISORDIL) 30 MG tablet Take 30 mg by mouth 4 (four) times daily.    [provider]  lactulose (CHRONULAC) 10 GM/15ML solution Take 15 mLs (10 g total) by mouth 2 (two) times daily. 04/01/18   Rehman, Mechele Dawley, MD  lisinopril (PRINIVIL,ZESTRIL) 5 MG tablet Take 5 mg by mouth daily.    [provider]  metFORMIN (GLUCOPHAGE) 1000 MG tablet Take 1,000 mg by mouth 2 (two) times daily with a meal.     [provider]  metoprolol succinate (TOPROL-XL) 25 MG 24 hr tablet Take 25 mg by mouth daily.    [provider]  Multiple Vitamin (STRESS B PO) Take 1 tablet by mouth daily.    [provider]  Multiple Vitamins-Minerals (MULTIVITAMIN PO) Take 1 tablet by mouth daily.    [provider]  omeprazole (PRILOSEC) 20 MG capsule Take 1 capsule (20 mg total) by mouth daily. 04/01/18   Rehman, Mechele Dawley, MD  sertraline (ZOLOFT) 50 MG tablet Take 50 mg by mouth daily.  07/12/17   [provider]  spironolactone (ALDACTONE) 50 MG tablet Take 2 tablets (100 mg total) by mouth daily. 10/03/17   Rogene Houston, MD    Physical Exam: Vitals:   10/15/18 2303  BP: (!) 137/59  Pulse: 61  Resp: 18  Temp: 97.8 F (36.6 C)  TempSrc: Oral  SpO2: 98%    Constitutional: NAD, appears uncomfortable, clutching emesis bag  Eyes: PERTLA, lids and conjunctivae normal ENMT: Mucous membranes are moist. Posterior pharynx clear of any exudate or lesions.   Neck: normal, supple, no masses, no thyromegaly Respiratory: clear to auscultation bilaterally, no wheezing, no crackles. Normal respiratory effort.   Cardiovascular: S1 & S2 heard, regular rate and rhythm. No extremity edema.   Abdomen: Distension  with fluid-wave, soft, non-tender. Bowel sounds active.  Musculoskeletal: no clubbing / cyanosis. No joint deformity upper and lower extremities.    Skin: no significant rashes, lesions, ulcers. Warm, dry, well-perfused. Neurologic: No facial asymmetry. Sensation intact. Moving all extremities.  Psychiatric:  Alert and oriented to person, place, and situation. Pleasant and cooperative.    Labs on Admission: I have personally reviewed following labs and imaging studies  CBC: No results for input(s): WBC, NEUTROABS, HGB, HCT, MCV, PLT in the last 168 hours. Basic Metabolic Panel: No results for input(s): NA, K, CL, CO2, GLUCOSE, BUN, CREATININE, CALCIUM, MG, PHOS in the last 168 hours. GFR: CrCl cannot be calculated (Patient's most recent lab result is older than the maximum 21 days allowed.). Liver Function Tests: No results for input(s): AST, ALT, ALKPHOS, BILITOT, PROT, ALBUMIN in the last 168 hours. No results for input(s): LIPASE, AMYLASE in the last 168 hours. No results for input(s): AMMONIA in  the last 168 hours. Coagulation Profile: No results for input(s): INR, PROTIME in the last 168 hours. Cardiac Enzymes: No results for input(s): CKTOTAL, CKMB, CKMBINDEX, TROPONINI in the last 168 hours. BNP (last 3 results) No results for input(s): PROBNP in the last 8760 hours. HbA1C: No results for input(s): HGBA1C in the last 72 hours. CBG: No results for input(s): GLUCAP in the last 168 hours. Lipid Profile: No results for input(s): CHOL, HDL, LDLCALC, TRIG, CHOLHDL, LDLDIRECT in the last 72 hours. Thyroid Function Tests: No results for input(s): TSH, T4TOTAL, FREET4, T3FREE, THYROIDAB in the last 72 hours. Anemia Panel: No results for input(s): VITAMINB12, FOLATE, FERRITIN, TIBC, IRON, RETICCTPCT in the last 72 hours. Urine analysis: No results found for: COLORURINE, APPEARANCEUR, LABSPEC, PHURINE, GLUCOSEU, HGBUR, BILIRUBINUR, KETONESUR, PROTEINUR, UROBILINOGEN, NITRITE,  LEUKOCYTESUR Sepsis Labs: @LABRCNTIP (procalcitonin:4,lacticidven:4) )No results found for this or any previous visit (from the past 240 hour(s)).   Radiological Exams on Admission: No results found.  EKG: Independently reviewed. Sinus rhythm, low-voltage QRS.   Assessment/Plan   1. Acute renal failure; metabolic acidosis   - Presents from nephrology clinic for SCr of 5.9, up from 2 a month ago and 1 in April  - No hydronephrosis or obstructive uropathy on CT in ED  - There is concern for hepatorenal syndrome - he denies decreased UOP, but could still be early HRS; prerenal azotemia also considered given nausea with poor appetite and chronic diarrhea over the past couple months  - He was treated with 2 liters of NS in ED  - SLIV for now, check UA and urine chemistries, repeat chem panel to see if there is improvement with volume expansion    2. Cirrhosis with ascites  - Reportedly secondary to NASH  - He has resistant ascites and last had paracentesis 2 wks ago; abdomen soft on admission  - Upper abdominal varices noted on CT, no esophageal varices on EGD from April 2019  - Diuretics held on admission in setting ARF with N/V and suspected intravascular volume depletion  - Hold lactulose initially given diarrhea and normal ammonia in ED  - Continue PPI    3. Hypertension  - BP at goal  - Hold lisinopril in light of ARF, hold metoprolol in setting of cirrhosis and consider replacing with a non-selective beta-blocker   4. Insulin-dependent DM  - A1c 8.1% in January   - Managed at home with Lantus 40 units qD  - He has loss of appetite and acute renal failure, so will start with 10 units Lantus daily with a low-intensity SSI and adjust as needed    5. Macrocytic anemia; thrombocytopenia  - Hgb appears stable at 9.1 on admission with platelets down to 54k from 80k range earlier this year  - No active bleeding; likely secondary to chronic liver disease    DVT prophylaxis: SCD's    Code Status: Full  Family Communication: Son and wife updated at bedside Consults called: None Admission status: Inpatient     Vianne Bulls, MD [facility] Pager [phone]  If 7PM-7AM, please contact night-coverage [email or web address] Password [email or web address]  10/16/2018, 12:46 AM

## 2018-10-17 LAB — GLUCOSE, CAPILLARY
GLUCOSE-CAPILLARY: 67 mg/dL — AB (ref 70–99)
GLUCOSE-CAPILLARY: 194 mg/dL — AB (ref 70–99)
Glucose-Capillary: 202 mg/dL — ABNORMAL HIGH (ref 70–99)
Glucose-Capillary: 179 mg/dL — ABNORMAL HIGH (ref 70–99)

## 2018-10-17 LAB — UREA NITROGEN, URINE: UREA NITROGEN UR: 611 mg/dL

## 2018-10-17 LAB — RENAL FUNCTION PANEL
Albumin: 3 g/dL — ABNORMAL LOW (ref 3.5–5.0)
Anion gap: 6 (ref 5–15)
BUN: 69 mg/dL — ABNORMAL HIGH (ref 8–23)
CHLORIDE: 117 mmol/L — AB (ref 98–111)
CO2: 16 mmol/L — AB (ref 22–32)
CREATININE: 5.58 mg/dL — AB (ref 0.61–1.24)
Calcium: 8.8 mg/dL — ABNORMAL LOW (ref 8.9–10.3)
GFR, EST AFRICAN AMERICAN: 10 mL/min — AB (ref >60)
GFR, EST NON AFRICAN AMERICAN: 9 mL/min — AB (ref >60)
Glucose, Bld: 76 mg/dL (ref 70–99)
POTASSIUM: 5.2 mmol/L — AB (ref 3.5–5.1)
Phosphorus: 5 mg/dL — ABNORMAL HIGH (ref 2.5–4.6)
Sodium: 139 mmol/L (ref 135–145)

## 2018-10-17 LAB — CBC
HCT: 25.1 % — ABNORMAL LOW (ref 39.0–52.0)
HEMOGLOBIN: 7.9 g/dL — AB (ref 13.0–17.0)
MCH: 31.7 pg (ref 26.0–34.0)
MCHC: 31.5 g/dL (ref 30.0–36.0)
MCV: 100.8 fL — ABNORMAL HIGH (ref 80.0–100.0)
PLATELETS: 30 10*3/uL — AB (ref 150–400)
RBC: 2.49 MIL/uL — ABNORMAL LOW (ref 4.22–5.81)
RDW: 15.1 % (ref 11.5–15.5)
WBC: 3.2 10*3/uL — AB (ref 4.0–10.5)
nRBC: 0 % (ref 0.0–0.2)

## 2018-10-17 MED ORDER — SERTRALINE HCL 50 MG PO TABS: 50.0000 mg | ORAL_TABLET | Freq: Every day | ORAL | Status: DC

## 2018-10-17 MED ORDER — ALBUMIN HUMAN 5 % IV SOLN
INTRAVENOUS | Status: AC
  Filled 2018-10-17: qty 250

## 2018-10-17 MED ORDER — DEXTROSE 50 % IV SOLN
INTRAVENOUS | Status: AC
  Administered 2018-10-17: 50 mL
  Filled 2018-10-17: qty 50

## 2018-10-17 MED ORDER — ALBUMIN HUMAN 5 % IV SOLN
50.0000 g | Freq: Four times a day (QID) | INTRAVENOUS | Status: DC
  Filled 2018-10-17: qty 250

## 2018-10-17 MED ORDER — PATIROMER SORBITEX CALCIUM 8.4 G PO PACK
16.8000 g | PACK | Freq: Every day | ORAL | Status: DC
  Filled 2018-10-17: qty 2

## 2018-10-17 MED ORDER — LACTULOSE 10 GM/15ML PO SOLN
10.0000 g | Freq: Two times a day (BID) | ORAL | Status: DC
  Administered 2018-10-17 – 2018-10-23 (×13): 10 g via ORAL
  Filled 2018-10-17 (×13): qty 15

## 2018-10-17 NOTE — Progress Notes (Addendum)
[facility] PROGRESS NOTE  Assessment/ Plan: Pt is a 72 y.o. yo male history of NASH, liver cirrhosis, ascites with frequent paracenteses, diabetes, hypertension, hyperlipidemia transferred from [facility] with AKI.  On admission patient with creatinine of 5.6, potassium 5.5, serum CO2 13, platelet 39.  Patient had large volume paracentesis about 2 weeks ago when 8.5 L of fluid was removed.  #Acute kidney injury likely progressive renal disease due to hepatorenal syndrome: He started on octreotide, midodrine and received IV albumin.  Serum creatinine level is stable at 5.5 today.  Urine output of only 350 cc recorded unknown if it is accurate.  Urinalysis with pyuria, rare bacteria with no protein or RBCs.  US renal done on 08/28/2018 showed normal size kidneys with normal echogenicity. -Order 3 more dose of IV albumin -Continue current medications including sodium bicarbonate. -Continue to hold diuretics, ACE inhibitor -Patient is marginal candidate for hemodialysis if no improvement in kidney function.  I have discussed this with the patient and his wife at bedside. -Monitor BMP, avoid nephrotoxins  #Hyperkalemia was on ACE inhibitor and Aldactone: Serum potassium level 5.2 today.  Continue to monitor for now.  V[place] if potassium is more than 5.5.   #Metabolic acidosis: Continue sodium bicarbonate.  #Liver cirrhosis, portal hypertension, coagulopathy: Hold off on paracentesis.  #Anemia: Check iron stores.  Subjective: Seen and examined at bedside.  Denied nausea, vomiting, chest pain, shortness of breath, abdominal pain.  Patient's wife at bedside. Objective Vital signs in last 24 hours: Vitals:   10/16/18 1640 10/16/18 2003 10/17/18 0506 10/17/18 0945  BP: (!) 113/59 (!) 102/53 111/64 123/65  Pulse: 65 64 69 71  Resp: 20 19 19 18   Temp: 97.6 F (36.4 C) 97.8 F (36.6 C) 97.9 F (36.6 C) 98 F (36.7 C)  TempSrc: Oral Oral Oral Oral  SpO2: 99% 97%  96% 98%  Weight:  99.8 kg     Weight change:   Intake/Output Summary (Last 24 hours) at 10/17/2018 1021 Last data filed at 10/17/2018 0600 Gross per 24 hour  Intake 2108.77 ml  Output 350 ml  Net 1758.77 ml       Labs: Basic Metabolic Panel: Recent Labs  Lab 10/16/18 0516 10/17/18 0827  NA 137 139  K 5.5* 5.2*  CL 117* 117*  CO2 13* 16*  GLUCOSE 177* 76  BUN 71* 69*  CREATININE 5.68* 5.58*  CALCIUM 8.4* 8.8*  PHOS  --  5.0*   Liver Function Tests: Recent Labs  Lab 10/16/18 0516 10/17/18 0827  AST 62*  --   ALT 36  --   ALKPHOS 115  --   BILITOT 1.1  --   PROT 5.2*  --   ALBUMIN 2.3* 3.0*   No results for input(s): LIPASE, AMYLASE in the last 168 hours. No results for input(s): AMMONIA in the last 168 hours. CBC: Recent Labs  Lab 10/16/18 0516  WBC 4.3  NEUTROABS 3.2  HGB 8.2*  HCT 26.2*  MCV 100.8*  PLT 39*   Cardiac Enzymes: No results for input(s): CKTOTAL, CKMB, CKMBINDEX, TROPONINI in the last 168 hours. CBG: Recent Labs  Lab 10/16/18 0746 10/16/18 1113 10/16/18 1643 10/16/18 2009 10/17/18 0751  GLUCAP 136* 151* 155* 189* 67*    Iron Studies: No results for input(s): IRON, TIBC, TRANSFERRIN, FERRITIN in the last 72 hours. Studies/Results: No results found.  Medications: Infusions: . sodium chloride    . albumin human    . albumin human 75 g (10/16/18 1702)  .  octreotide  (SANDOSTATIN)    IV infusion 50 mcg/hr (10/17/18 0205)    Scheduled Medications: . insulin aspart  0-5 Units Subcutaneous QHS  . insulin aspart  0-9 Units Subcutaneous TID WC  . insulin glargine  10 Units Subcutaneous QHS  . lactulose  10 g Oral BID  . midodrine  10 mg Oral TID WC  . pantoprazole  40 mg Oral Daily  . patiromer  16.8 g Oral Daily  . sodium bicarbonate  650 mg Oral TID  . sodium chloride flush  3 mL Intravenous Q12H    have reviewed scheduled and prn medications.  Physical Exam: General:NAD, comfortable Heart:RRR, s1s2 nl Lungs:clear  b/l, no cracjle Abdomen:soft, Non-tender, distended Extremities:No edema    Prasad  10/17/2018,10:21 AM  LOS: 2 days

## 2018-10-17 NOTE — Progress Notes (Addendum)
PROGRESS NOTE    Larry Hayes  XNT:[id number] DOB: 08/06/1946 DOA: 10/15/2018 PCP: Larry Mater, MD    Brief Narrative: Larry Hayes a 72 y.o.malewith medical history significant forNASH progressed to cirrhosis with ascites, insulin-dependent diabetes mellitus, hypertension, and chronic anemia and thrombocytopenia, now presenting to the emergency department at the direction of his nephrologist for evaluation of worsening renal function. Upon arrival to the ED, patient is found to be afebrile, saturating well on room air, and with vitals otherwise normal. EKG features a sinus rhythm with low voltage QRS. Chest x-ray features low volume lungs without any acute finding. CT of the abdomen and pelvis is negative for hydronephrosis or obstructive uropathy, notable for punctate stones in the lower pole the left kidney, cirrhosis with splenomegaly and upper abdominal varices, and massive ascites. He was transferred to [facility] for evaluation of ARF for possible hepatorenal syndrome.    Assessment & Plan:   Principal Problem:   Acute renal failure (ARF) (HCC) Active Problems:   Insulin-requiring or dependent type II diabetes mellitus (HCC)   Macrocytic anemia   Hepatic cirrhosis (HCC)   Thrombocytopenia (HCC)   Metabolic acidosis   Acute renal failure (HCC)   Acute renal failure differentials include hepatorenal syndrome versus progressive renal disease Nephrology consulted patient was given 2 doses of IV albumin.  There is no improvement in renal para meters creatinine continues to remain high.  Creatinine is 5.58 and potassium is 5.2 and phosphorus is 5.  His urine output is about 350 mL in the last 24 hours.  Urology recommended 3 more doses of IV albumin and to continue with sodium bicarbonate.  If his creatinine does not improve he will be a marginal candidate for hemodialysis.  We will continue to hold his diuretics ACE inhibitor are and avoid other nephrotoxins at this time.  Resume  midodrine and octreotide.   Mild metabolic acidosis continues to improve resume sodium bicarbonate   Liver cirrhosis with ascites and portal hypertension ultrasound paracentesis is on hold for now because of his AKI.  Started lactulose  Anemia of chronic disease probably secondary to liver cirrhosis.  Transfuse to keep hemoglobin greater than 7 repeat CBC in the morning.  Check anemia panel    Thrombocytopenia probably from liver cirrhosis  platelets at the 39 000 repeat CBC today.  No signs of overt bleeding.   Insulin-dependent diabetes mellitus CBG (last 3)  Recent Labs    10/16/18 2009 10/17/18 0751 10/17/18 1127  GLUCAP 189* 67* 194*   Restart renal diet and resume sliding scale insulin.   DVT prophylaxis: SCDs Code Status: Full code Family Communication: Larry Hayes at bedside, discussed the treatment plan with the patient and family at bedside Disposition Plan: Pending improvement in renal para meters  Consultants:   Nephrology   Procedures: None   Antimicrobials: None   Subjective: Patient requesting to eat.  Wife is concerned that he was not on lactulose at this time we have restarted the lactulose.  Patient denies any chest pain or shortness of breath.  Objective: Vitals:   10/16/18 1640 10/16/18 2003 10/17/18 0506 10/17/18 0945  BP: (!) 113/59 (!) 102/53 111/64 123/65  Pulse: 65 64 69 71  Resp: 20 19 19 18   Temp: 97.6 F (36.4 C) 97.8 F (36.6 C) 97.9 F (36.6 C) 98 F (36.7 C)  TempSrc: Oral Oral Oral Oral  SpO2: 99% 97% 96% 98%  Weight:  99.8 kg      Intake/Output Summary (Last 24 hours) at 10/17/2018 1137 Last  data filed at 10/17/2018 0900 Gross per 24 hour  Intake 2228.77 ml  Output 350 ml  Net 1878.77 ml   Filed Weights   10/16/18 2003  Weight: 99.8 kg    Examination:  General exam: Mild discomfort from abdominal distention Respiratory system: Diminished at bases no wheezing or rhonchi Cardiovascular system: S1 & S2 heard, RRR. No  JVD,. No pedal edema. Gastrointestinal system: Abdomen is distended with faint bowel sounds, soft Central nervous system: Alert and oriented.  Nonfocal Extremities: Symmetric 5 x 5 power. Skin: No rashes, lesions or ulcers Psychiatry: . Mood & affect appropriate.     Data Reviewed: I have personally reviewed following labs and imaging studies CBC: Recent Labs  Lab 10/16/18 0516  WBC 4.3  NEUTROABS 3.2  HGB 8.2*  HCT 26.2*  MCV 100.8*  PLT 39*   Basic Metabolic Panel: Recent Labs  Lab 10/16/18 0516 10/17/18 0827  NA 137 139  K 5.5* 5.2*  CL 117* 117*  CO2 13* 16*  GLUCOSE 177* 76  BUN 71* 69*  CREATININE 5.68* 5.58*  CALCIUM 8.4* 8.8*  PHOS  --  5.0*   GFR: Estimated Creatinine Clearance: 13.5 mL/min (A) (by C-G formula based on SCr of 5.58 mg/dL (H)). Liver Function Tests: Recent Labs  Lab 10/16/18 0516 10/17/18 0827  AST 62*  --   ALT 36  --   ALKPHOS 115  --   BILITOT 1.1  --   PROT 5.2*  --   ALBUMIN 2.3* 3.0*   No results for input(s): LIPASE, AMYLASE in the last 168 hours. No results for input(s): AMMONIA in the last 168 hours. Coagulation Profile: Recent Labs  Lab 10/16/18 0516  INR 1.69   Cardiac Enzymes: No results for input(s): CKTOTAL, CKMB, CKMBINDEX, TROPONINI in the last 168 hours. BNP (last 3 results) No results for input(s): PROBNP in the last 8760 hours. HbA1C: No results for input(s): HGBA1C in the last 72 hours. CBG: Recent Labs  Lab 10/16/18 0746 10/16/18 1113 10/16/18 1643 10/16/18 2009 10/17/18 0751  GLUCAP 136* 151* 155* 189* 67*   Lipid Profile: No results for input(s): CHOL, HDL, LDLCALC, TRIG, CHOLHDL, LDLDIRECT in the last 72 hours. Thyroid Function Tests: No results for input(s): TSH, T4TOTAL, FREET4, T3FREE, THYROIDAB in the last 72 hours. Anemia Panel: No results for input(s): VITAMINB12, FOLATE, FERRITIN, TIBC, IRON, RETICCTPCT in the last 72 hours. Sepsis Labs: No results for input(s): PROCALCITON,  LATICACIDVEN in the last 168 hours.  No results found for this or any previous visit (from the past 240 hour(s)).       Radiology Studies: No results found.      Scheduled Meds: . insulin aspart  0-5 Units Subcutaneous QHS  . insulin aspart  0-9 Units Subcutaneous TID WC  . insulin glargine  10 Units Subcutaneous QHS  . lactulose  10 g Oral BID  . midodrine  10 mg Oral TID WC  . pantoprazole  40 mg Oral Daily  . sodium bicarbonate  650 mg Oral TID  . sodium chloride flush  3 mL Intravenous Q12H   Continuous Infusions: . sodium chloride    . albumin human 75 g (10/16/18 1702)  . octreotide  (SANDOSTATIN)    IV infusion 50 mcg/hr (10/17/18 0205)     LOS: 2 days    Time spent: 34 minutes    Hosie Poisson, MD [facility] Pager [phone] If 7PM-7AM, please contact night-coverage [email or web address] Password [email or web address] 10/17/2018, 11:37 AM

## 2018-10-17 NOTE — Care Management Note (Signed)
Case Management Note  Patient Details  Name: Larry Hayes MRN: [id number] Date of Birth: 06/29/1946  Subjective/Objective:  Pt presented for worsening renal function. PTA from home with support of wife. Pt may benefit from PT/OT consult for recommendations for disposition.                    Action/Plan: CM will continue to monitor for additional disposition needs.   Expected Discharge Date:                  Expected Discharge Plan:  Home w Home Health Services  In-House Referral:     Discharge planning Services  CM Consult  Post Acute Care Choice:    Choice offered to:     DME Arranged:    DME Agency:     HH Arranged:    HH Agency:     Status of Service:  In process, will continue to follow  If discussed at Long Length of Stay Meetings, dates discussed:    Additional Comments:  Gala Lewandowsky, RN 10/17/2018, 3:11 PM

## 2018-10-17 NOTE — Care Management Important Message (Signed)
Important Message  Patient Details  Name: Larry Hayes MRN: [id number] Date of Birth: [birth year]   Medicare Important Message Given:  Yes    Charbel Los [place] 10/17/2018, 3:43 PM

## 2018-10-18 DIAGNOSIS — Z515 Encounter for palliative care: Secondary | ICD-10-CM

## 2018-10-18 LAB — GLUCOSE, CAPILLARY
GLUCOSE-CAPILLARY: 158 mg/dL — AB (ref 70–99)
GLUCOSE-CAPILLARY: 136 mg/dL — AB (ref 70–99)
GLUCOSE-CAPILLARY: 164 mg/dL — AB (ref 70–99)
GLUCOSE-CAPILLARY: 154 mg/dL — AB (ref 70–99)

## 2018-10-18 LAB — RENAL FUNCTION PANEL
ALBUMIN: 3.4 g/dL — AB (ref 3.5–5.0)
ANION GAP: 7 (ref 5–15)
BUN: 65 mg/dL — ABNORMAL HIGH (ref 8–23)
CALCIUM: 8.5 mg/dL — AB (ref 8.9–10.3)
CO2: 15 mmol/L — ABNORMAL LOW (ref 22–32)
Chloride: 116 mmol/L — ABNORMAL HIGH (ref 98–111)
Creatinine, Ser: 5.65 mg/dL — ABNORMAL HIGH (ref 0.61–1.24)
GFR calc non Af Amer: 9 mL/min — ABNORMAL LOW (ref >60)
GFR, EST AFRICAN AMERICAN: 10 mL/min — AB (ref >60)
GLUCOSE: 198 mg/dL — AB (ref 70–99)
PHOSPHORUS: 4.9 mg/dL — AB (ref 2.5–4.6)
POTASSIUM: 5.1 mmol/L (ref 3.5–5.1)
Sodium: 138 mmol/L (ref 135–145)

## 2018-10-18 LAB — CBC
HCT: 23.4 % — ABNORMAL LOW (ref 39.0–52.0)
HEMOGLOBIN: 7.2 g/dL — AB (ref 13.0–17.0)
MCH: 31.3 pg (ref 26.0–34.0)
MCHC: 30.8 g/dL (ref 30.0–36.0)
MCV: 101.7 fL — ABNORMAL HIGH (ref 80.0–100.0)
NRBC: 0 % (ref 0.0–0.2)
PLATELETS: 29 10*3/uL — AB (ref 150–400)
RBC: 2.3 MIL/uL — AB (ref 4.22–5.81)
RDW: 15.1 % (ref 11.5–15.5)
WBC: 3.1 10*3/uL — AB (ref 4.0–10.5)

## 2018-10-18 LAB — FERRITIN: Ferritin: 49 ng/mL (ref 24–336)

## 2018-10-18 LAB — IRON AND TIBC
Iron: 70 ug/dL (ref 45–182)
SATURATION RATIOS: 51 % — AB (ref 17.9–39.5)
TIBC: 139 ug/dL — ABNORMAL LOW (ref 250–450)
UIBC: 69 ug/dL

## 2018-10-18 MED ORDER — ALBUMIN HUMAN 25 % IV SOLN
50.0000 g | Freq: Once | INTRAVENOUS | Status: AC
  Administered 2018-10-19: 50 g via INTRAVENOUS
  Filled 2018-10-18 (×2): qty 200

## 2018-10-18 MED ORDER — SODIUM BICARBONATE 650 MG PO TABS
1300.0000 mg | ORAL_TABLET | Freq: Three times a day (TID) | ORAL | Status: DC
  Administered 2018-10-18 – 2018-10-23 (×15): 1300 mg via ORAL
  Filled 2018-10-18 (×15): qty 2

## 2018-10-18 MED ORDER — DARBEPOETIN ALFA 100 MCG/0.5ML IJ SOSY
100.0000 ug | PREFILLED_SYRINGE | Freq: Once | INTRAMUSCULAR | Status: AC
  Administered 2018-10-19: 100 ug via SUBCUTANEOUS
  Filled 2018-10-18 (×2): qty 0.5

## 2018-10-18 MED ORDER — PROMETHAZINE HCL 25 MG/ML IJ SOLN: 12.5000 mg | Freq: Four times a day (QID) | INTRAMUSCULAR | Status: DC | PRN

## 2018-10-18 NOTE — Progress Notes (Signed)
[facility] NEPHROLOGY PROGRESS NOTE  Assessment/ Plan: Pt is a 72 y.o. yo male history of NASH, liver cirrhosis, ascites with frequent paracenteses, diabetes, hypertension, hyperlipidemia transferred from [facility] with AKI.  On admission patient with creatinine of 5.6, potassium 5.5, serum CO2 13, platelet 39.  Patient had large volume paracentesis about 2 weeks ago when 8.5 L of fluid was removed.  #Acute kidney injury likely progressive renal disease due to hepatorenal syndrome:  -Urine output recorded 670 cc in 24 hours.  Serum creatinine level stable at 5.65.  BUN 65.  Plan to continue midodrine and octreotide today.  Received IV albumin.  Monitor urine output, BMP.  Increased sodium bicarbonate for low CO2. - Urinalysis with pyuria, rare bacteria with no protein or RBCs.  US renal done on 08/28/2018 showed normal size kidneys with normal echogenicity. -Continue to hold diuretics, ACE inhibitor -Patient is marginal candidate for hemodialysis if no improvement in kidney function.  I have discussed this with the patient and his wife at bedside on 11/8.  #Hyperkalemia was on ACE inhibitor and Aldactone: Serum potassium level 5.2 today.  Continue to monitor for now.  Veltassa if potassium is more than 5.5.   #Metabolic acidosis: Increase the dose of sodium bicarbonate.  #Liver cirrhosis, portal hypertension, coagulopathy: Hold off on paracentesis.  #Anemia: Iron stores acceptable.  Ordered a dose of Aranesp.  Subjective: Seen and examined at bedside.  No new event.  Marginal urine output.  Lying in bed comfortable.   Objective Vital signs in last 24 hours: Vitals:   10/17/18 1622 10/17/18 2058 10/18/18 0441 10/18/18 0809  BP: (!) 139/59 129/60 (!) 114/56 (!) 113/57  Pulse: 62 63 66 64  Resp: 18 18 14 18   Temp: 98.1 F (36.7 C) 98.2 F (36.8 C) 98 F (36.7 C) 98.1 F (36.7 C)  TempSrc: Oral Oral Oral Oral  SpO2: 98% 98% 97% 96%  Weight:       Weight change:    Intake/Output Summary (Last 24 hours) at 10/18/2018 1043 Last data filed at 10/18/2018 2671 Gross per 24 hour  Intake 983.23 ml  Output 671 ml  Net 312.23 ml       Labs: Basic Metabolic Panel: Recent Labs  Lab 10/16/18 0516 10/17/18 0827 10/18/18 0436  NA 137 139 138  K 5.5* 5.2* 5.1  CL 117* 117* 116*  CO2 13* 16* 15*  GLUCOSE 177* 76 198*  BUN 71* 69* 65*  CREATININE 5.68* 5.58* 5.65*  CALCIUM 8.4* 8.8* 8.5*  PHOS  --  5.0* 4.9*   Liver Function Tests: Recent Labs  Lab 10/16/18 0516 10/17/18 0827 10/18/18 0436  AST 62*  --   --   ALT 36  --   --   ALKPHOS 115  --   --   BILITOT 1.1  --   --   PROT 5.2*  --   --   ALBUMIN 2.3* 3.0* 3.4*   No results for input(s): LIPASE, AMYLASE in the last 168 hours. No results for input(s): AMMONIA in the last 168 hours. CBC: Recent Labs  Lab 10/16/18 0516 10/17/18 0827 10/18/18 0436  WBC 4.3 3.2* 3.1*  NEUTROABS 3.2  --   --   HGB 8.2* 7.9* 7.2*  HCT 26.2* 25.1* 23.4*  MCV 100.8* 100.8* 101.7*  PLT 39* 30* 29*   Cardiac Enzymes: No results for input(s): CKTOTAL, CKMB, CKMBINDEX, TROPONINI in the last 168 hours. CBG: Recent Labs  Lab 10/17/18 0751 10/17/18 1127 10/17/18 1621 10/17/18 2057  10/18/18 0809  GLUCAP 67* 194* 202* 179* 158*    Iron Studies:  Recent Labs    10/18/18 0436  IRON 70  TIBC 139*  FERRITIN 49   Studies/Results: No results found.  Medications: Infusions: . sodium chloride    . octreotide  (SANDOSTATIN)    IV infusion 50 mcg/hr (10/17/18 2043)    Scheduled Medications: . darbepoetin (ARANESP) injection - DIALYSIS  100 mcg Intravenous Once  . insulin aspart  0-5 Units Subcutaneous QHS  . insulin aspart  0-9 Units Subcutaneous TID WC  . insulin glargine  10 Units Subcutaneous QHS  . lactulose  10 g Oral BID  . midodrine  10 mg Oral TID WC  . pantoprazole  40 mg Oral Daily  . sodium bicarbonate  1,300 mg Oral TID  . sodium chloride flush  3 mL Intravenous Q12H    have  reviewed scheduled and prn medications.  Physical Exam: General: Lying in bed comfortable, not in distress Heart:RRR, s1s2 nl Lungs:clear b/l, no cracjle Abdomen:soft, Non-tender, distended Extremities: No lower extremity edema.   Toniann Dickerson Prasad Isabela Nardelli 10/18/2018,10:43 AM  LOS: 3 days

## 2018-10-18 NOTE — Progress Notes (Signed)
Dr. Blake Divine paged and made aware that Paracentes can not be performed this evening, per Ultrasound Department  Advised that Albumin be rescheduled for tomorrow and be given after the procedure. Carried out. Elvera Bicker, BSN, RN

## 2018-10-18 NOTE — Consult Note (Signed)
Consultation Note Date: 10/18/2018   Patient Name: [facility]  DOB: [birth year]  MRN: [id number]  Age / Sex: 72 y.o., male  PCP: Olena Mater, MD Referring Physician: Hosie Poisson, MD  Reason for Consultation: Establishing goals of care  HPI/Patient Profile: 72 y.o. 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  Patient was transferred from outside hospital for additional work-up and management.  Palliative care was consulted to help address goals of care.  Clinical Assessment and Goals of Care: Met with patient and his wife.  Together we reviewed patient's current medical problems and prognosis.  He has been managed with IV albumin with plan to initiate midodrine and octreotide.  Nephrology is following and feels patient is likely a poor candidate for hemodialysis.  Patient seems aware that his condition is guarded.  He says "this is bad".  Both he and wife are able to summarize previous conversations with other medical providers.  Patient tells me that he would consider hemodialysis if it was felt that he had a reasonable chance at meaningful improvement.  Otherwise, patient says he would likely forego aggressive treatment.  Patient says he is ready to die if it is his time.  However, he is in agreement with continued medical treatment to see if there is a chance of clinical improvement.  We discussed CODE STATUS.  Patient verbalized clearly that he would not want to be resuscitated or have his life prolonged artificially on machines like a ventilator.  Wife says she agrees with this decision.  Both say they would like for him to be a DO NOT RESUSCITATE.  DNR order placed  per family request.  We will follow him clinically and continue conversations regarding goals.   Prior to this hospitalization, patient was living at home with his wife.  He says he was relatively independent with his self-care but had to take frequent breaks due to fatigue.  He was requiring paracentesis every 2 weeks.  Patient is married.  He has 2 sons.  Patient does not have advance directives.  He would want his wife to be his decision maker if necessary.  MELD-Na of 27.   SUMMARY OF RECOMMENDATIONS   1.  Continue medical treatment and supportive care 2.  DNR 3.  We will follow and continue conversations regarding goals     Primary Diagnoses: Present on Admission: . Hepatic cirrhosis ([place]) . Thrombocytopenia ([place]) . Macrocytic anemia . Acute renal failure (ARF) ([place]) . Metabolic acidosis . Acute renal failure ([place])   I have reviewed the medical record, interviewed the patient and family, and examined the patient. The following aspects are pertinent.  Past Medical History:  Diagnosis Date  . Anemia   . Anxiety   . Arthritis   . Diabetes ([place])   . GERD (gastroesophageal reflux disease)   . Hepatic cirrhosis ([place]) 08/21/2016  .  High cholesterol   . HOH (hard of hearing)   . Hypertension   . Sleep apnea   . Thrombocytopenia ([place]) 08/21/2016   Social History   Socioeconomic History  . Marital status: Married    Spouse name: Not on file  . Number of children: Not on file  . Years of education: Not on file  . Highest education level: Not on file  Occupational History  . Not on file  Social Needs  . Financial resource strain: Not on file  . Food insecurity:    Worry: Not on file    Inability: Not on file  . Transportation needs:    Medical: Not on file    Non-medical: Not on file  Tobacco Use  . Smoking status: Former Smoker    Packs/day: 1.50    Types: Cigarettes    Last attempt to quit: 05/23/2001    Years since quitting: 17.4  . Smokeless tobacco:  Never Used  Substance and Sexual Activity  . Alcohol use: No    Alcohol/week: 0.0 standard drinks  . Drug use: No  . Sexual activity: Yes    Birth control/protection: None  Lifestyle  . Physical activity:    Days per week: Not on file    Minutes per session: Not on file  . Stress: Not on file  Relationships  . Social connections:    Talks on phone: Not on file    Gets together: Not on file    Attends religious service: Not on file    Active member of club or organization: Not on file    Attends meetings of clubs or organizations: Not on file    Relationship status: Not on file  Other Topics Concern  . Not on file  Social History Narrative  . Not on file   Family History  Problem Relation Age of Onset  . Obesity Son    Scheduled Meds: . darbepoetin (ARANESP) injection - DIALYSIS  100 mcg Subcutaneous Once  . insulin aspart  0-5 Units Subcutaneous QHS  . insulin aspart  0-9 Units Subcutaneous TID WC  . insulin glargine  10 Units Subcutaneous QHS  . lactulose  10 g Oral BID  . midodrine  10 mg Oral TID WC  . pantoprazole  40 mg Oral Daily  . sodium bicarbonate  1,300 mg Oral TID  . sodium chloride flush  3 mL Intravenous Q12H   Continuous Infusions: . sodium chloride    . octreotide  (SANDOSTATIN)    IV infusion 50 mcg/hr (10/17/18 2043)   PRN Meds:.sodium chloride, ondansetron (ZOFRAN) IV, promethazine, sodium chloride flush Medications Prior to Admission:  Prior to Admission medications   Medication Sig Start Date End Date Taking? Authorizing Provider  aspirin EC 81 MG tablet Take 81 mg by mouth daily.   Yes [provider]  atorvastatin (LIPITOR) 80 MG tablet Take 80 mg by mouth at bedtime.   Yes [provider]  furosemide (LASIX) 40 MG tablet Take 1 tablet (40 mg total) by mouth daily. 07/03/18  Yes Setzer, Terri L, NP  insulin glargine (LANTUS) 100 UNIT/ML injection Inject 40 Units into the skin at bedtime.    Yes [provider]    lisinopril (PRINIVIL,ZESTRIL) 5 MG tablet Take 5 mg by mouth daily.   Yes [provider]  metoprolol succinate (TOPROL-XL) 25 MG 24 hr tablet Take 25 mg by mouth daily.   Yes [provider]  Multiple Vitamins-Minerals (MULTIVITAMIN PO) Take 1 tablet by mouth daily.  Yes [provider]  omeprazole (PRILOSEC) 20 MG capsule Take 1 capsule (20 mg total) by mouth daily. 04/01/18  Yes Rehman, Mechele Dawley, MD  spironolactone (ALDACTONE) 50 MG tablet Take 2 tablets (100 mg total) by mouth daily. 10/03/17  Yes Rehman, Mechele Dawley, MD  VELTASSA 16.8 g PACK Take 16.8 g by mouth daily. Dissolve 1 packet in 1/3 cup of water and drink daily. 10/09/18  Yes [provider]  isosorbide dinitrate (ISORDIL) 30 MG tablet Take 30 mg by mouth 4 (four) times daily.    [provider]  lactulose (CHRONULAC) 10 GM/15ML solution Take 15 mLs (10 g total) by mouth 2 (two) times daily. Patient not taking: Reported on 10/16/2018 04/01/18   Rogene Houston, MD  metFORMIN (GLUCOPHAGE) 1000 MG tablet Take 1,000 mg by mouth 2 (two) times daily with a meal.     [provider]  Multiple Vitamin (STRESS B PO) Take 1 tablet by mouth daily.    [provider]  sertraline (ZOLOFT) 50 MG tablet Take 50 mg by mouth daily.  07/12/17   [provider]   No Known Allergies Review of Systems  Constitutional: Positive for activity change, appetite change and fatigue.    Physical Exam  Constitutional: He is oriented to person, place, and time.  Frail appearing  Cardiovascular: Normal rate and regular rhythm.  Pulmonary/Chest: Effort normal and breath sounds normal.  Abdominal:  Distended, nttp  Neurological: He is alert and oriented to person, place, and time.  Skin: Skin is warm and dry.    Vital Signs: BP (!) 113/57 (BP Location: Right Arm)   Pulse 64   Temp 98.1 F (36.7 C) (Oral)   Resp 18   Wt 99.8 kg   SpO2 96%   BMI 32.49 kg/m  Pain Scale: 0-10    Pain Score: 0-No pain   SpO2: SpO2: 96 % O2 Device:SpO2: 96 % O2 Flow Rate: .   IO: Intake/output summary:   Intake/Output Summary (Last 24 hours) at 10/18/2018 1526 Last data filed at 10/18/2018 1403 Gross per 24 hour  Intake 1044.13 ml  Output 671 ml  Net 373.13 ml    LBM: Last BM Date: 10/18/18 Baseline Weight: Weight: 99.8 kg Most recent weight: Weight: 99.8 kg     Palliative Assessment/Data:     Time In: 1500 Time Out: 1550 Time Total: 50 minutes Greater than 50%  of this time was spent counseling and coordinating care related to the above assessment and plan.  Signed by: Irean Hong, NP   Please contact Palliative Medicine Team phone at [phone] for questions and concerns.  For individual provider: See Shea Evans

## 2018-10-18 NOTE — Progress Notes (Signed)
PROGRESS NOTE    [facility]  ZHG:[id number] DOB: [birth year] DOA: 10/15/2018 PCP: Olena Mater, MD    Brief Narrative: [facility] a 72 y.o.malewith medical history significant forNASH progressed to cirrhosis with ascites, insulin-dependent diabetes mellitus, hypertension, and chronic anemia and thrombocytopenia, now presenting to the emergency department at the direction of his nephrologist for evaluation of worsening renal function. Upon arrival to the ED, patient is found to be afebrile, saturating well on room air, and with vitals otherwise normal. EKG features a sinus rhythm with low voltage QRS. Chest x-ray features low volume lungs without any acute finding. CT of the abdomen and pelvis is negative for hydronephrosis or obstructive uropathy, notable for punctate stones in the lower pole the left kidney, cirrhosis with splenomegaly and upper abdominal varices, and massive ascites. He was transferred to [facility] for evaluation of ARF for possible hepatorenal syndrome.    Assessment & Plan:   Principal Problem:   Acute renal failure (ARF) (HCC) Active Problems:   Insulin-requiring or dependent type II diabetes mellitus (HCC)   Macrocytic anemia   Hepatic cirrhosis (HCC)   Thrombocytopenia (H[facility])   Metabolic acidosis   Acute renal failure (H[facility])   Acute renal failure differentials include hepatorenal syndrome versus progressive renal disease:  No improvement in renal para meters even after 3 doses of IV albumin, midodrine and octreotide. Nephrology consulted, recommended 3 more doses of IV albumin and to continue with sodium bicarbonate.  If his creatinine does not improve he will be a marginal candidate for hemodialysis.  We will continue to hold his diuretics ACE inhibitor are and avoid other nephrotoxins at this time.  Resume midodrine and octreotide. Discussed the poor prognosis with the patient and his wif and requested palliative care consult for goals of care.   Mild  metabolic acidosis; not much improvement. resume sodium bicarbonate   Liver cirrhosis with ascites and portal hypertension : US paracentesis is on hold for now because of his AKI.  Plan for ultrasound paracentesis in the next 24 hours Started lactulose   Anemia of chronic disease probably secondary to liver cirrhosis.   Transfuse to keep hemoglobin greater than 7  repeat CBC show hemoglobin of 7.2.    Thrombocytopenia probably from liver cirrhosis  Platelets continue to drop at 29,000 today.   No signs of overt bleeding.   Insulin-dependent diabetes mellitus CBG (last 3)  Recent Labs    10/17/18 1621 10/17/18 2057 10/18/18 0809  GLUCAP 202* 179* 158*   Restart renal diet and resume sliding scale insulin. No change in medications   DVT prophylaxis: SCDs Code Status: Full code Family Communication: Discussed with wife at bedside Disposition Plan: Pending improvement in renal para meters  Consultants:   Nephrology  Palliative care   Procedures: None   Antimicrobials: None   Subjective: Patient nauseated this morning not responding to Zofran.  IV Phenergan ordered as needed. Objective: Vitals:   10/17/18 1622 10/17/18 2058 10/18/18 0441 10/18/18 0809  BP: (!) 139/59 129/60 (!) 114/56 (!) 113/57  Pulse: 62 63 66 64  Resp: 18 18 14 18   Temp: 98.1 F (36.7 C) 98.2 F (36.8 C) 98 F (36.7 C) 98.1 F (36.7 C)  TempSrc: Oral Oral Oral Oral  SpO2: 98% 98% 97% 96%  Weight:        Intake/Output Summary (Last 24 hours) at 10/18/2018 0952 Last data filed at 10/18/2018 1962 Gross per 24 hour  Intake 983.23 ml  Output 671 ml  Net 312.23 ml   Danley Danker  Weights   10/16/18 2003  Weight: 99.8 kg    Examination:  General exam: Mild to moderate discomfort from abdominal distention and some shortness of breath Respiratory system: Diminished air entry at bases due to abdominal distention, no wheezing or rhonchi Cardiovascular system: S1 & S2 heard, RRR.  Murmur,  trace pedal edema Gastrointestinal system: Abdomen is distended, firm with faint bowel sounds. Central nervous system: Alert and oriented.  Nonfocal Extremities: No cyanosis or clubbing Skin: No rashes, lesions or ulcers Psychiatry: . Mood & affect appropriate.     Data Reviewed: I have personally reviewed following labs and imaging studies CBC: Recent Labs  Lab 10/16/18 0516 10/17/18 0827 10/18/18 0436  WBC 4.3 3.2* 3.1*  NEUTROABS 3.2  --   --   HGB 8.2* 7.9* 7.2*  HCT 26.2* 25.1* 23.4*  MCV 100.8* 100.8* 101.7*  PLT 39* 30* 29*   Basic Metabolic Panel: Recent Labs  Lab 10/16/18 0516 10/17/18 0827 10/18/18 0436  NA 137 139 138  K 5.5* 5.2* 5.1  CL 117* 117* 116*  CO2 13* 16* 15*  GLUCOSE 177* 76 198*  BUN 71* 69* 65*  CREATININE 5.68* 5.58* 5.65*  CALCIUM 8.4* 8.8* 8.5*  PHOS  --  5.0* 4.9*   GFR: Estimated Creatinine Clearance: 13.4 mL/min (A) (by C-G formula based on SCr of 5.65 mg/dL (H)). Liver Function Tests: Recent Labs  Lab 10/16/18 0516 10/17/18 0827 10/18/18 0436  AST 62*  --   --   ALT 36  --   --   ALKPHOS 115  --   --   BILITOT 1.1  --   --   PROT 5.2*  --   --   ALBUMIN 2.3* 3.0* 3.4*   No results for input(s): LIPASE, AMYLASE in the last 168 hours. No results for input(s): AMMONIA in the last 168 hours. Coagulation Profile: Recent Labs  Lab 10/16/18 0516  INR 1.69   Cardiac Enzymes: No results for input(s): CKTOTAL, CKMB, CKMBINDEX, TROPONINI in the last 168 hours. BNP (last 3 results) No results for input(s): PROBNP in the last 8760 hours. HbA1C: No results for input(s): HGBA1C in the last 72 hours. CBG: Recent Labs  Lab 10/17/18 0751 10/17/18 1127 10/17/18 1621 10/17/18 2057 10/18/18 0809  GLUCAP 67* 194* 202* 179* 158*   Lipid Profile: No results for input(s): CHOL, HDL, LDLCALC, TRIG, CHOLHDL, LDLDIRECT in the last 72 hours. Thyroid Function Tests: No results for input(s): TSH, T4TOTAL, FREET4, T3FREE, THYROIDAB in  the last 72 hours. Anemia Panel: Recent Labs    10/18/18 0436  FERRITIN 49  TIBC 139*  IRON 70   Sepsis Labs: No results for input(s): PROCALCITON, LATICACIDVEN in the last 168 hours.  No results found for this or any previous visit (from the past 240 hour(s)).       Radiology Studies: No results found.      Scheduled Meds: . insulin aspart  0-5 Units Subcutaneous QHS  . insulin aspart  0-9 Units Subcutaneous TID WC  . insulin glargine  10 Units Subcutaneous QHS  . lactulose  10 g Oral BID  . midodrine  10 mg Oral TID WC  . pantoprazole  40 mg Oral Daily  . sodium bicarbonate  1,300 mg Oral TID  . sodium chloride flush  3 mL Intravenous Q12H   Continuous Infusions: . sodium chloride    . octreotide  (SANDOSTATIN)    IV infusion 50 mcg/hr (10/17/18 2043)     LOS: 3 days  Time spent: 34 minutes    Hosie Poisson, MD [facility] Pager [phone] If 7PM-7AM, please contact night-coverage [email or web address] Password [email or web address] 10/18/2018, 9:52 AM

## 2018-10-19 ENCOUNTER — Inpatient Hospital Stay (HOSPITAL_COMMUNITY): Payer: Medicare Other

## 2018-10-19 LAB — COMPREHENSIVE METABOLIC PANEL WITH GFR
ALT: 20 U/L (ref 0–44)
AST: 53 U/L — ABNORMAL HIGH (ref 15–41)
Albumin: 3.1 g/dL — ABNORMAL LOW (ref 3.5–5.0)
Alkaline Phosphatase: 80 U/L (ref 38–126)
Anion gap: 6 (ref 5–15)
BUN: 65 mg/dL — ABNORMAL HIGH (ref 8–23)
CO2: 16 mmol/L — ABNORMAL LOW (ref 22–32)
Calcium: 8.8 mg/dL — ABNORMAL LOW (ref 8.9–10.3)
Chloride: 116 mmol/L — ABNORMAL HIGH (ref 98–111)
Creatinine, Ser: 5.75 mg/dL — ABNORMAL HIGH (ref 0.61–1.24)
GFR calc Af Amer: 10 mL/min — ABNORMAL LOW
GFR calc non Af Amer: 9 mL/min — ABNORMAL LOW
Glucose, Bld: 184 mg/dL — ABNORMAL HIGH (ref 70–99)
Potassium: 5.4 mmol/L — ABNORMAL HIGH (ref 3.5–5.1)
Sodium: 138 mmol/L (ref 135–145)
Total Bilirubin: 1.8 mg/dL — ABNORMAL HIGH (ref 0.3–1.2)
Total Protein: 5.1 g/dL — ABNORMAL LOW (ref 6.5–8.1)

## 2018-10-19 LAB — CBC
HCT: 26.1 % — ABNORMAL LOW (ref 39.0–52.0)
Hemoglobin: 7.9 g/dL — ABNORMAL LOW (ref 13.0–17.0)
MCH: 30.6 pg (ref 26.0–34.0)
MCHC: 30.3 g/dL (ref 30.0–36.0)
MCV: 101.2 fL — ABNORMAL HIGH (ref 80.0–100.0)
Platelets: 54 K/uL — ABNORMAL LOW (ref 150–400)
RBC: 2.58 MIL/uL — ABNORMAL LOW (ref 4.22–5.81)
RDW: 15.3 % (ref 11.5–15.5)
WBC: 4.7 K/uL (ref 4.0–10.5)
nRBC: 0 % (ref 0.0–0.2)

## 2018-10-19 LAB — GLUCOSE, CAPILLARY
GLUCOSE-CAPILLARY: 171 mg/dL — AB (ref 70–99)
Glucose-Capillary: 193 mg/dL — ABNORMAL HIGH (ref 70–99)
Glucose-Capillary: 202 mg/dL — ABNORMAL HIGH (ref 70–99)

## 2018-10-19 MED ORDER — PATIROMER SORBITEX CALCIUM 8.4 G PO PACK
16.8000 g | PACK | Freq: Once | ORAL | Status: AC
  Administered 2018-10-19: 16.8 g via ORAL
  Filled 2018-10-19: qty 2

## 2018-10-19 MED ORDER — LIDOCAINE HCL (PF) 1 % IJ SOLN
INTRAMUSCULAR | Status: AC
  Administered 2018-10-19: 11:00:00
  Filled 2018-10-19: qty 30

## 2018-10-19 NOTE — Progress Notes (Signed)
PROGRESS NOTE    Larry Hayes  KGY:[id number] DOB: March 08, 1946 DOA: 10/15/2018 PCP: Olena Mater, MD    Brief Narrative: Larry Hayes a 72 y.o.malewith medical history significant forNASH progressed to cirrhosis with ascites, insulin-dependent diabetes mellitus, hypertension, and chronic anemia and thrombocytopenia, now presenting to the emergency department at the direction of his nephrologist for evaluation of worsening renal function. Upon arrival to the ED, patient is found to be afebrile, saturating well on room air, and with vitals otherwise normal. EKG features a sinus rhythm with low voltage QRS. Chest x-ray features low volume lungs without any acute finding. CT of the abdomen and pelvis is negative for hydronephrosis or obstructive uropathy, notable for punctate stones in the lower pole the left kidney, cirrhosis with splenomegaly and upper abdominal varices, and massive ascites. He was transferred to [facility] for evaluation of ARF for possible hepatorenal syndrome.    Assessment & Plan:   Principal Problem:   Acute renal failure (ARF) (HCC) Active Problems:   Insulin-requiring or dependent type II diabetes mellitus (HCC)   Macrocytic anemia   Hepatic cirrhosis (HCC)   Thrombocytopenia (HCC)   Metabolic acidosis   Acute renal failure (HCC)   Palliative care encounter   Acute renal failure differentials include hepatorenal syndrome versus progressive renal disease:  No improvement in renal para meters even after IV albumin, midodrine and octreotide. Nephrology consulted, following the patient.  If his creatinine does not improve he will be a marginal candidate for hemodialysis.  We will continue to hold his diuretics ACE inhibitor are and avoid other nephrotoxins at this time.   Discussed the poor prognosis with the patient and his wif and requested palliative care consult for goals of care. Monitor another 24 hours to see if his renal para meters and urine output  improves.   Mild metabolic acidosis; not much improvement. resume sodium bicarbonate   Liver cirrhosis with ascites and portal hypertension : US paracentesis is on hold for now because of his AKI.  Ultrasound paracentesis done in 3 L taken out .  Continue with lactulose.   Anemia of chronic disease probably secondary to liver cirrhosis.   Transfuse to keep hemoglobin greater than 7 Hemoglobin of 7.9    Thrombocytopenia probably from liver cirrhosis  Platelets are 54,000 today   No signs of overt bleeding.   Insulin-dependent diabetes mellitus CBG (last 3)  Recent Labs    10/18/18 1721 10/18/18 2103 10/19/18 1203  GLUCAP 164* 154* 171*   Restart renal diet and resume sliding scale insulin. No change in medications   DVT prophylaxis: SCDs Code Status: Full code Family Communication: Discussed with wife at bedside Disposition Plan: Pending improvement in renal para meters plan for renal ultrasound today and possible home with home hospice if his renal para meters do not improve  Consultants:   Nephrology  Palliative care   Procedures: Ultrasound paracentesis   Antimicrobials: None   Subjective: Patient reports he had a rough night, some abdominal discomfort.  Patient reports some nausea, no vomiting.  Did have 2 bowel movement since yesterday. Objective: Vitals:   10/19/18 1027 10/19/18 1032 10/19/18 1039 10/19/18 1130  BP: 132/60 (!) 135/59 133/60 124/61  Pulse:    63  Resp:    18  Temp:    97.8 F (36.6 C)  TempSrc:    Oral  SpO2:    98%  Weight:        Intake/Output Summary (Last 24 hours) at 10/19/2018 1458 Last data filed at 10/19/2018 0600  Gross per 24 hour  Intake 576.99 ml  Output 0 ml  Net 576.99 ml   Filed Weights   10/16/18 2003 10/18/18 2102  Weight: 99.8 kg 99.7 kg    Examination:  General exam: Moderate discomfort from abdominal distention Respiratory system: Decreased air entry at bases due to abdominal distention.  No  wheezing heard cardiovascular system: S1 & S2 heard, RRR.  Faint murmur, trace pedal edema Gastrointestinal system: Abdomen is distended, soft with bowel sounds Central nervous system: Alert and oriented.  Nonfocal Extremities: No cyanosis or clubbing trace pedal edema Skin: No rashes, lesions or ulcers Psychiatry: . Mood & affect appropriate.     Data Reviewed: I have personally reviewed following labs and imaging studies CBC: Recent Labs  Lab 10/16/18 0516 10/17/18 0827 10/18/18 0436 10/19/18 0919  WBC 4.3 3.2* 3.1* 4.7  NEUTROABS 3.2  --   --   --   HGB 8.2* 7.9* 7.2* 7.9*  HCT 26.2* 25.1* 23.4* 26.1*  MCV 100.8* 100.8* 101.7* 101.2*  PLT 39* 30* 29* 54*   Basic Metabolic Panel: Recent Labs  Lab 10/16/18 0516 10/17/18 0827 10/18/18 0436 10/19/18 0919  NA 137 139 138 138  K 5.5* 5.2* 5.1 5.4*  CL 117* 117* 116* 116*  CO2 13* 16* 15* 16*  GLUCOSE 177* 76 198* 184*  BUN 71* 69* 65* 65*  CREATININE 5.68* 5.58* 5.65* 5.75*  CALCIUM 8.4* 8.8* 8.5* 8.8*  PHOS  --  5.0* 4.9*  --    GFR: Estimated Creatinine Clearance: 13.1 mL/min (A) (by C-G formula based on SCr of 5.75 mg/dL (H)). Liver Function Tests: Recent Labs  Lab 10/16/18 0516 10/17/18 0827 10/18/18 0436 10/19/18 0919  AST 62*  --   --  53*  ALT 36  --   --  20  ALKPHOS 115  --   --  80  BILITOT 1.1  --   --  1.8*  PROT 5.2*  --   --  5.1*  ALBUMIN 2.3* 3.0* 3.4* 3.1*   No results for input(s): LIPASE, AMYLASE in the last 168 hours. No results for input(s): AMMONIA in the last 168 hours. Coagulation Profile: Recent Labs  Lab 10/16/18 0516  INR 1.69   Cardiac Enzymes: No results for input(s): CKTOTAL, CKMB, CKMBINDEX, TROPONINI in the last 168 hours. BNP (last 3 results) No results for input(s): PROBNP in the last 8760 hours. HbA1C: No results for input(s): HGBA1C in the last 72 hours. CBG: Recent Labs  Lab 10/18/18 0809 10/18/18 1150 10/18/18 1721 10/18/18 2103 10/19/18 1203  GLUCAP  158* 136* 164* 154* 171*   Lipid Profile: No results for input(s): CHOL, HDL, LDLCALC, TRIG, CHOLHDL, LDLDIRECT in the last 72 hours. Thyroid Function Tests: No results for input(s): TSH, T4TOTAL, FREET4, T3FREE, THYROIDAB in the last 72 hours. Anemia Panel: Recent Labs    10/18/18 0436  FERRITIN 49  TIBC 139*  IRON 70   Sepsis Labs: No results for input(s): PROCALCITON, LATICACIDVEN in the last 168 hours.  No results found for this or any previous visit (from the past 240 hour(s)).       Radiology Studies: US Paracentesis  Result Date: 10/19/2018 INDICATION: History of NASH, liver cirrhosis and recurrent ascites requiring frequent paracenteses. Request for therapeutic paracentesis with 3 liter maximum. EXAM: ULTRASOUND GUIDED THERAPEUTIC PARACENTESIS MEDICATIONS: 10 mL 1% lidocaine COMPLICATIONS: None immediate. PROCEDURE: Informed written consent was obtained from the patient after a discussion of the risks, benefits and alternatives to treatment. A timeout was performed prior  to the initiation of the procedure. Initial ultrasound scanning demonstrates a large amount of ascites within the right lower abdominal quadrant. The right lower abdomen was prepped and draped in the usual sterile fashion. 1% lidocaine was used for local anesthesia. Following this, a 19 gauge, 10-cm, Yueh catheter was introduced. An ultrasound image was saved for documentation purposes. The paracentesis was performed. The catheter was removed and a dressing was applied. The patient tolerated the procedure well without immediate post procedural complication. FINDINGS: A total of approximately 3.0 L of clear yellow fluid was removed which was requested maximum. Residual fluid remains in abdomen. IMPRESSION: Successful ultrasound-guided paracentesis yielding 3.0 liters of peritoneal fluid. Read by Candiss Norse, PA-C Electronically Signed   By: Jacqulynn Cadet M.D.   On: 10/19/2018 11:11        Scheduled  Meds: . darbepoetin (ARANESP) injection - DIALYSIS  100 mcg Subcutaneous Once  . insulin aspart  0-5 Units Subcutaneous QHS  . insulin aspart  0-9 Units Subcutaneous TID WC  . insulin glargine  10 Units Subcutaneous QHS  . lactulose  10 g Oral BID  . midodrine  10 mg Oral TID WC  . pantoprazole  40 mg Oral Daily  . patiromer  16.8 g Oral Once  . sodium bicarbonate  1,300 mg Oral TID  . sodium chloride flush  3 mL Intravenous Q12H   Continuous Infusions: . sodium chloride    . octreotide  (SANDOSTATIN)    IV infusion 50 mcg/hr (10/19/18 1159)     LOS: 4 days    Time spent: 34 minutes    Hosie Poisson, MD [facility] Pager [phone] If 7PM-7AM, please contact night-coverage [email or web address] Password [email or web address] 10/19/2018, 2:58 PM

## 2018-10-19 NOTE — Procedures (Signed)
PROCEDURE SUMMARY:  Successful image-guided paracentesis from the right lower abdomen.  Yielded 3.0 liters of clear yellow fluid which was requested maximum No immediate complications.  Patient tolerated well.   Specimen was not sent for labs.  Please see imaging section of Epic for full dictation.  Villa Herb PA-C 10/19/2018 10:33 AM

## 2018-10-19 NOTE — Progress Notes (Signed)
Bladder scan showed 120 ml of urine

## 2018-10-19 NOTE — Progress Notes (Addendum)
[facility] NEPHROLOGY PROGRESS NOTE  Assessment/ Plan: Pt is a 72 y.o. yo male history of NASH, liver cirrhosis, ascites with frequent paracenteses, diabetes, hypertension, hyperlipidemia transferred from [facility] with AKI.  On admission patient with creatinine of 5.6, potassium 5.5, serum CO2 13, platelet 39.  Patient had large volume paracentesis about 2 weeks ago when 8.5 L of fluid was removed.  #Acute kidney injury likely progressive renal disease due to hepatorenal syndrome:  -Urine output recorded only 200 cc in 24 hours.  Serum creatinine level 5.7, BUN 65 which is stable for the last to 3 days.  He underwent paracentesis today with 3 L of fluid removal. He is on albumin, midodrine and octreotide.  No sign of renal recovery so far.  I have discussed with the patient and his son today. Patient is a marginal candidate for dialysis.  Palliative care has seen the patient. -If no improvement in renal function by tomorrow, plan to discontinue octreotide. Check bladder scan. -Continue sodium bicarbonate. - Urinalysis with pyuria, rare bacteria with no protein or RBCs.  US renal done on 08/28/2018 showed normal size kidneys with normal echogenicity. -Continue to hold diuretics, ACE inhibitor  #Hyperkalemia was on ACE inhibitor and Aldactone: Serum potassium level 5.4 today.  Continue to monitor for now. Ordered Veltassa.  #Metabolic acidosis: on sodium bicarbonate.  #Liver cirrhosis, portal hypertension, coagulopathy: Paracentesis today and he feels better.  #Anemia: Iron stores acceptable.  Ordered a dose of Aranesp on 11/9.  Subjective: Seen and examined at bedside.  He underwent abdominal paracentesis today.  Reports abdomen distention is better.  Denied nausea, vomiting, chest pain, shortness of breath.  Very minimal urine output.  Patient's son at bedside.  Objective Vital signs in last 24 hours: Vitals:   10/19/18 1027 10/19/18 1032 10/19/18 1039 10/19/18 1130   BP: 132/60 (!) 135/59 133/60 124/61  Pulse:    63  Resp:    18  Temp:    97.8 F (36.6 C)  TempSrc:    Oral  SpO2:    98%  Weight:       Weight change:   Intake/Output Summary (Last 24 hours) at 10/19/2018 1258 Last data filed at 10/19/2018 0600 Gross per 24 hour  Intake 696.99 ml  Output 0 ml  Net 696.99 ml       Labs: Basic Metabolic Panel: Recent Labs  Lab 10/17/18 0827 10/18/18 0436 10/19/18 0919  NA 139 138 138  K 5.2* 5.1 5.4*  CL 117* 116* 116*  CO2 16* 15* 16*  GLUCOSE 76 198* 184*  BUN 69* 65* 65*  CREATININE 5.58* 5.65* 5.75*  CALCIUM 8.8* 8.5* 8.8*  PHOS 5.0* 4.9*  --    Liver Function Tests: Recent Labs  Lab 10/16/18 0516 10/17/18 0827 10/18/18 0436 10/19/18 0919  AST 62*  --   --  53*  ALT 36  --   --  20  ALKPHOS 115  --   --  80  BILITOT 1.1  --   --  1.8*  PROT 5.2*  --   --  5.1*  ALBUMIN 2.3* 3.0* 3.4* 3.1*   No results for input(s): LIPASE, AMYLASE in the last 168 hours. No results for input(s): AMMONIA in the last 168 hours. CBC: Recent Labs  Lab 10/16/18 0516 10/17/18 0827 10/18/18 0436 10/19/18 0919  WBC 4.3 3.2* 3.1* 4.7  NEUTROABS 3.2  --   --   --   HGB 8.2* 7.9* 7.2* 7.9*  HCT 26.2* 25.1* 23.4*  26.1*  MCV 100.8* 100.8* 101.7* 101.2*  PLT 39* 30* 29* 54*   Cardiac Enzymes: No results for input(s): CKTOTAL, CKMB, CKMBINDEX, TROPONINI in the last 168 hours. CBG: Recent Labs  Lab 10/18/18 0809 10/18/18 1150 10/18/18 1721 10/18/18 2103 10/19/18 1203  GLUCAP 158* 136* 164* 154* 171*    Iron Studies:  Recent Labs    10/18/18 0436  IRON 70  TIBC 139*  FERRITIN 49   Studies/Results: US Paracentesis  Result Date: 10/19/2018 INDICATION: History of NASH, liver cirrhosis and recurrent ascites requiring frequent paracenteses. Request for therapeutic paracentesis with 3 liter maximum. EXAM: ULTRASOUND GUIDED THERAPEUTIC PARACENTESIS MEDICATIONS: 10 mL 1% lidocaine COMPLICATIONS: None immediate. PROCEDURE:  Informed written consent was obtained from the patient after a discussion of the risks, benefits and alternatives to treatment. A timeout was performed prior to the initiation of the procedure. Initial ultrasound scanning demonstrates a large amount of ascites within the right lower abdominal quadrant. The right lower abdomen was prepped and draped in the usual sterile fashion. 1% lidocaine was used for local anesthesia. Following this, a 19 gauge, 10-cm, Yueh catheter was introduced. An ultrasound image was saved for documentation purposes. The paracentesis was performed. The catheter was removed and a dressing was applied. The patient tolerated the procedure well without immediate post procedural complication. FINDINGS: A total of approximately 3.0 L of clear yellow fluid was removed which was requested maximum. Residual fluid remains in abdomen. IMPRESSION: Successful ultrasound-guided paracentesis yielding 3.0 liters of peritoneal fluid. Read by Candiss Norse, PA-C Electronically Signed   By: Jacqulynn Cadet M.D.   On: 10/19/2018 11:11    Medications: Infusions: . sodium chloride    . octreotide  (SANDOSTATIN)    IV infusion 50 mcg/hr (10/19/18 1159)    Scheduled Medications: . darbepoetin (ARANESP) injection - DIALYSIS  100 mcg Subcutaneous Once  . insulin aspart  0-5 Units Subcutaneous QHS  . insulin aspart  0-9 Units Subcutaneous TID WC  . insulin glargine  10 Units Subcutaneous QHS  . lactulose  10 g Oral BID  . midodrine  10 mg Oral TID WC  . pantoprazole  40 mg Oral Daily  . patiromer  16.8 g Oral Once  . sodium bicarbonate  1,300 mg Oral TID  . sodium chloride flush  3 mL Intravenous Q12H    have reviewed scheduled and prn medications.  Physical Exam: General: Lying in bed comfortable, not in distress Heart: Heart rate rhythm, S1-S2 normal Lungs: Clear bilateral, no wheezing or crackle. Abdomen:soft, Non-tender, distention is better Extremities: No lower extremity  edema.   Dron Prasad Bhandari 10/19/2018,12:58 PM  LOS: 4 days

## 2018-10-19 NOTE — Plan of Care (Signed)
  Problem: Education: Goal: Knowledge of disease and its progression will improve Outcome: Progressing   Problem: Health Behavior/Discharge Planning: Goal: Ability to manage health-related needs will improve Outcome: Progressing   Problem: Clinical Measurements: Goal: Complications related to the disease process or treatment will be avoided or minimized Outcome: Progressing Goal: Dialysis access will remain free of complications Outcome: Progressing   Problem: Activity: Goal: Activity intolerance will improve Outcome: Progressing   Problem: Fluid Volume: Goal: Fluid volume balance will be maintained or improved Outcome: Progressing   Problem: Nutritional: Goal: Ability to make appropriate dietary choices will improve Outcome: Progressing   Problem: Respiratory: Goal: Respiratory symptoms related to disease process will be avoided Outcome: Progressing   Problem: Self-Concept: Goal: Body image disturbance will be avoided or minimized Outcome: Progressing   Problem: Urinary Elimination: Goal: Progression of disease will be identified and treated Outcome: Progressing   Problem: Health Behavior/Discharge Planning: Goal: Ability to manage health-related needs will improve Outcome: Progressing   Problem: Activity: Goal: Risk for activity intolerance will decrease Outcome: Progressing   Problem: Nutrition: Goal: Adequate nutrition will be maintained Outcome: Progressing   Problem: Elimination: Goal: Will not experience complications related to bowel motility Outcome: Progressing Goal: Will not experience complications related to urinary retention Outcome: Progressing   Problem: Pain Managment: Goal: General experience of comfort will improve Outcome: Progressing   Problem: Safety: Goal: Ability to remain free from injury will improve Outcome: Progressing   Problem: Skin Integrity: Goal: Risk for impaired skin integrity will decrease Outcome: Progressing    Problem: Spiritual Needs Goal: Ability to function at adequate level Outcome: Progressing

## 2018-10-20 LAB — BASIC METABOLIC PANEL
ANION GAP: 6 (ref 5–15)
BUN: 64 mg/dL — AB (ref 8–23)
CALCIUM: 9 mg/dL (ref 8.9–10.3)
CO2: 18 mmol/L — AB (ref 22–32)
Chloride: 118 mmol/L — ABNORMAL HIGH (ref 98–111)
Creatinine, Ser: 5.78 mg/dL — ABNORMAL HIGH (ref 0.61–1.24)
GFR calc Af Amer: 10 mL/min — ABNORMAL LOW (ref >60)
GFR calc non Af Amer: 9 mL/min — ABNORMAL LOW (ref >60)
Glucose, Bld: 146 mg/dL — ABNORMAL HIGH (ref 70–99)
Potassium: 4.6 mmol/L (ref 3.5–5.1)
Sodium: 142 mmol/L (ref 135–145)

## 2018-10-20 LAB — GLUCOSE, CAPILLARY
GLUCOSE-CAPILLARY: 146 mg/dL — AB (ref 70–99)
Glucose-Capillary: 132 mg/dL — ABNORMAL HIGH (ref 70–99)
Glucose-Capillary: 158 mg/dL — ABNORMAL HIGH (ref 70–99)
Glucose-Capillary: 160 mg/dL — ABNORMAL HIGH (ref 70–99)

## 2018-10-20 NOTE — Progress Notes (Addendum)
Patient was laying back in bed, and hit his right lower arm on the edge of his bedside table. The NT noticed the skin tear when rounding on the patient. Wife was in room when the tear appeared. Patient now has a skin tear on right lower arm. I cleasned the skin tear, and put a foam over his arm. Will continue to monitor for bleeding.   Lillia Pauls RN

## 2018-10-20 NOTE — Progress Notes (Signed)
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 Upon arrival to the ED, patient is found to be afebrile, saturating well on room air, and with vitals otherwise normal. EKG features a sinus rhythm with low voltage QRS. Chest x-ray features low volume lungs without any acute finding. CT of the abdomen and pelvis is negative for hydronephrosis or obstructive uropathy, notable for punctate stones in the lower pole the left kidney, cirrhosis with splenomegaly and upper abdominal varices, and massive ascites. He was transferred to [facility] for evaluation of ARF for possible hepatorenal syndrome.    Assessment & Plan:   Principal Problem:   Acute renal failure (ARF) (HCC) Active Problems:   Insulin-requiring or dependent type II diabetes mellitus (HCC)   Macrocytic anemia   Hepatic cirrhosis (HCC)   Thrombocytopenia (HCC)   Metabolic acidosis   Acute renal failure (HCC)   Palliative care encounter   Acute renal failure differentials include hepatorenal syndrome versus progressive renal disease:  No improvement in renal para meters even after IV albumin, midodrine and octreotide. Nephrology consulted, following the patient. Not a candidate for HD. Palliative care consulted and discussions are underway.  We will continue to hold his diuretics ACE inhibitor are and avoid other nephrotoxins at this time.   Discussed the poor prognosis with the patient and his wife and requested palliative care consult for goals of care. Monitor another 24 hours to see if his renal para meters and urine output  improves.    Mild metabolic acidosis; not much improvement. resume sodium bicarbonate   Liver cirrhosis with ascites and portal hypertension : US paracentesis on 11/9, 3l it taken out. Pt reports feeling exhausted. cntinue with lactulose.  Check ammonia level in the morning.    Anemia of chronic disease probably secondary to liver cirrhosis.   Transfuse to keep hemoglobin greater than 7 Hemoglobin of 7.9    Thrombocytopenia probably from liver cirrhosis  Platelets are 54,000    No signs of overt bleeding.   Insulin-dependent diabetes mellitus CBG (last 3)  Recent Labs    10/19/18 2041 10/20/18 0718 10/20/18 1117  GLUCAP 202* 132* 158*   Restart renal diet and resume sliding scale insulin. No change in medications   DVT prophylaxis: SCDs Code Status: Full code Family Communication: Discussed with wife at bedside Disposition Plan: possible home with home hospice in the next 24 hours.  Consultants:   Nephrology  Palliative care   Procedures: Ultrasound paracentesis   Antimicrobials: None   Subjective: Some sob earlier this am.  Lethargic,  Nauseated, no vomiting.   No chest pain .   Objective: Vitals:   10/19/18 1640 10/19/18 2040 10/20/18 0423 10/20/18 0719  BP: 133/60 (!) 127/54 (!) 123/55 (!) 150/53  Pulse: 64 63 67 63  Resp: 18 18 20 18   Temp: 98 F (36.7 C) 98.3 F (36.8 C) 98.1 F (36.7 C) 98.2 F (36.8 C)  TempSrc: Oral   Oral  SpO2: 99% 95% 99% 99%  Weight:        Intake/Output Summary (Last 24 hours) at 10/20/2018 1610 Last data filed at 10/20/2018 1402 Gross per 24 hour  Intake 983 ml  Output 850 ml  Net  133 ml   Filed Weights   10/16/18 2003 10/18/18 2102  Weight: 99.8 kg 99.7 kg    Examination:  General exam: continues to have mod discomfort  Respiratory system: air entry fair , no wheezing or rhonchi.   cardiovascular system: S1 & S2 heard, RRR.  Faint murmur, trace pedal edema Gastrointestinal system: Abdomen  is distended, non tender non distended bowel sounds good.  Central nervous system: Alert and oriented.  Nonfocal Extremities: No cyanosis or clubbing trace pedal edema Skin: No rashes, lesions or ulcers Psychiatry: . Mood & affect appropriate.     Data Reviewed: I have personally reviewed following labs and imaging studies CBC: Recent Labs  Lab 10/16/18 0516 10/17/18 0827 10/18/18 0436 10/19/18 0919  WBC 4.3 3.2* 3.1* 4.7  NEUTROABS 3.2  --   --   --   HGB 8.2* 7.9* 7.2* 7.9*  HCT 26.2* 25.1* 23.4* 26.1*  MCV 100.8* 100.8* 101.7* 101.2*  PLT 39* 30* 29* 54*   Basic Metabolic Panel: Recent Labs  Lab 10/16/18 0516 10/17/18 0827 10/18/18 0436 10/19/18 0919 10/20/18 0639  NA 137 139 138 138 142  K 5.5* 5.2* 5.1 5.4* 4.6  CL 117* 117* 116* 116* 118*  CO2 13* 16* 15* 16* 18*  GLUCOSE 177* 76 198* 184* 146*  BUN 71* 69* 65* 65* 64*  CREATININE 5.68* 5.58* 5.65* 5.75* 5.78*  CALCIUM 8.4* 8.8* 8.5* 8.8* 9.0  PHOS  --  5.0* 4.9*  --   --    GFR: Estimated Creatinine Clearance: 13.1 mL/min (A) (by C-G formula based on SCr of 5.78 mg/dL (H)). Liver Function Tests: Recent Labs  Lab 10/16/18 0516 10/17/18 0827 10/18/18 0436 10/19/18 0919  AST 62*  --   --  53*  ALT 36  --   --  20  ALKPHOS 115  --   --  80  BILITOT 1.1  --   --  1.8*  PROT 5.2*  --   --  5.1*  ALBUMIN 2.3* 3.0* 3.4* 3.1*   No results for input(s): LIPASE, AMYLASE in the last 168 hours. No results for input(s): AMMONIA in the last 168 hours. Coagulation Profile: Recent Labs  Lab 10/16/18 0516  INR 1.69   Cardiac Enzymes: No results for input(s): CKTOTAL, CKMB, CKMBINDEX, TROPONINI in the last 168 hours. BNP (last 3 results) No results for input(s): PROBNP in the last 8760 hours. HbA1C: No results for input(s): HGBA1C in the last 72 hours. CBG: Recent Labs  Lab 10/19/18 1203 10/19/18 1640 10/19/18 2041 10/20/18 0718 10/20/18 1117  GLUCAP 171* 193* 202* 132* 158*   Lipid Profile: No  results for input(s): CHOL, HDL, LDLCALC, TRIG, CHOLHDL, LDLDIRECT in the last 72 hours. Thyroid Function Tests: No results for input(s): TSH, T4TOTAL, FREET4, T3FREE, THYROIDAB in the last 72 hours. Anemia Panel: Recent Labs    10/18/18 0436  FERRITIN 49  TIBC 139*  IRON 70   Sepsis Labs: No results for input(s): PROCALCITON, LATICACIDVEN in the last 168 hours.  No results found for this or any previous visit (from the past 240 hour(s)).       Radiology Studies: US Paracentesis  Result Date: 10/19/2018 INDICATION: History of NASH, liver cirrhosis and recurrent ascites requiring frequent paracenteses. Request for therapeutic paracentesis with 3 liter maximum. EXAM: ULTRASOUND GUIDED THERAPEUTIC PARACENTESIS MEDICATIONS: 10 mL 1% lidocaine COMPLICATIONS: None immediate. PROCEDURE: Informed written consent was obtained from the patient after a discussion of the risks, benefits and alternatives to treatment. A timeout was performed  prior to the initiation of the procedure. Initial ultrasound scanning demonstrates a large amount of ascites within the right lower abdominal quadrant. The right lower abdomen was prepped and draped in the usual sterile fashion. 1% lidocaine was used for local anesthesia. Following this, a 19 gauge, 10-cm, Yueh catheter was introduced. An ultrasound image was saved for documentation purposes. The paracentesis was performed. The catheter was removed and a dressing was applied. The patient tolerated the procedure well without immediate post procedural complication. FINDINGS: A total of approximately 3.0 L of clear yellow fluid was removed which was requested maximum. Residual fluid remains in abdomen. IMPRESSION: Successful ultrasound-guided paracentesis yielding 3.0 liters of peritoneal fluid. Read by Candiss Norse, PA-C Electronically Signed   By: Jacqulynn Cadet M.D.   On: 10/19/2018 11:11        Scheduled Meds: . insulin aspart  0-5 Units Subcutaneous  QHS  . insulin aspart  0-9 Units Subcutaneous TID WC  . insulin glargine  10 Units Subcutaneous QHS  . lactulose  10 g Oral BID  . midodrine  10 mg Oral TID WC  . pantoprazole  40 mg Oral Daily  . sodium bicarbonate  1,300 mg Oral TID  . sodium chloride flush  3 mL Intravenous Q12H   Continuous Infusions: . sodium chloride    . octreotide  (SANDOSTATIN)    IV infusion 50 mcg/hr (10/20/18 0015)     LOS: 5 days    Time spent: 34 minutes    Hosie Poisson, MD [facility] Pager [phone] If 7PM-7AM, please contact night-coverage [email or web address] Password [email or web address] 10/20/2018, 4:10 PM

## 2018-10-20 NOTE — Progress Notes (Signed)
S: Feels okay but has had some nausea this morning.  No vomiting and improving with medications. O:BP (!) 150/53 (BP Location: Right Leg)   Pulse 63   Temp 98.2 F (36.8 C) (Oral)   Resp 18   Wt 99.7 kg   SpO2 99%   BMI 32.46 kg/m   Intake/Output Summary (Last 24 hours) at 10/20/2018 1322 Last data filed at 10/20/2018 1202 Gross per 24 hour  Intake 1467.62 ml  Output 200 ml  Net 1267.62 ml   Intake/Output: I/O last 3 completed shifts: In: 1337.5 [P.O.:640; I.V.:497.5; IV Piggyback:200] Out: 600 [Urine:600]  Intake/Output this shift:  Total I/O In: 520 [P.O.:520] Out: 0  Weight change:  Gen: NAD, slowed mentation, weak and chronically ill-appearing WM CVS: no rub  Resp:decreased BS at bases Abd: distended, +BS, tense, +fluid wave, no rebound or guarding Ext:1+ edema  Recent Labs  Lab 10/16/18 0516 10/17/18 0827 10/18/18 0436 10/19/18 0919 10/20/18 0639  NA 137 139 138 138 142  K 5.5* 5.2* 5.1 5.4* 4.6  CL 117* 117* 116* 116* 118*  CO2 13* 16* 15* 16* 18*  GLUCOSE 177* 76 198* 184* 146*  BUN 71* 69* 65* 65* 64*  CREATININE 5.68* 5.58* 5.65* 5.75* 5.78*  ALBUMIN 2.3* 3.0* 3.4* 3.1*  --   CALCIUM 8.4* 8.8* 8.5* 8.8* 9.0  PHOS  --  5.0* 4.9*  --   --   AST 62*  --   --  53*  --   ALT 36  --   --  20  --    Liver Function Tests: Recent Labs  Lab 10/16/18 0516 10/17/18 0827 10/18/18 0436 10/19/18 0919  AST 62*  --   --  53*  ALT 36  --   --  20  ALKPHOS 115  --   --  80  BILITOT 1.1  --   --  1.8*  PROT 5.2*  --   --  5.1*  ALBUMIN 2.3* 3.0* 3.4* 3.1*   No results for input(s): LIPASE, AMYLASE in the last 168 hours. No results for input(s): AMMONIA in the last 168 hours. CBC: Recent Labs  Lab 10/16/18 0516 10/17/18 0827 10/18/18 0436 10/19/18 0919  WBC 4.3 3.2* 3.1* 4.7  NEUTROABS 3.2  --   --   --   HGB 8.2* 7.9* 7.2* 7.9*  HCT 26.2* 25.1* 23.4* 26.1*  MCV 100.8* 100.8* 101.7* 101.2*  PLT 39* 30* 29* 54*   Cardiac Enzymes: No results for  input(s): CKTOTAL, CKMB, CKMBINDEX, TROPONINI in the last 168 hours. CBG: Recent Labs  Lab 10/19/18 1203 10/19/18 1640 10/19/18 2041 10/20/18 0718 10/20/18 1117  GLUCAP 171* 193* 202* 132* 158*    Iron Studies:  Recent Labs    10/18/18 0436  IRON 70  TIBC 139*  FERRITIN 49   Studies/Results: US Paracentesis  Result Date: 10/19/2018 INDICATION: History of NASH, liver cirrhosis and recurrent ascites requiring frequent paracenteses. Request for therapeutic paracentesis with 3 liter maximum. EXAM: ULTRASOUND GUIDED THERAPEUTIC PARACENTESIS MEDICATIONS: 10 mL 1% lidocaine COMPLICATIONS: None immediate. PROCEDURE: Informed written consent was obtained from the patient after a discussion of the risks, benefits and alternatives to treatment. A timeout was performed prior to the initiation of the procedure. Initial ultrasound scanning demonstrates a large amount of ascites within the right lower abdominal quadrant. The right lower abdomen was prepped and draped in the usual sterile fashion. 1% lidocaine was used for local anesthesia. Following this, a 19 gauge, 10-cm, Yueh catheter was introduced. An ultrasound  image was saved for documentation purposes. The paracentesis was performed. The catheter was removed and a dressing was applied. The patient tolerated the procedure well without immediate post procedural complication. FINDINGS: A total of approximately 3.0 L of clear yellow fluid was removed which was requested maximum. Residual fluid remains in abdomen. IMPRESSION: Successful ultrasound-guided paracentesis yielding 3.0 liters of peritoneal fluid. Read by Lynnette Caffey, PA-C Electronically Signed   By: Malachy Moan M.D.   On: 10/19/2018 11:11   . insulin aspart  0-5 Units Subcutaneous QHS  . insulin aspart  0-9 Units Subcutaneous TID WC  . insulin glargine  10 Units Subcutaneous QHS  . lactulose  10 g Oral BID  . midodrine  10 mg Oral TID WC  . pantoprazole  40 mg Oral Daily  .  sodium bicarbonate  1,300 mg Oral TID  . sodium chloride flush  3 mL Intravenous Q12H    BMET    Component Value Date/Time   NA 142 10/20/2018 0639   K 4.6 10/20/2018 0639   CL 118 (H) 10/20/2018 0639   CO2 18 (L) 10/20/2018 0639   GLUCOSE 146 (H) 10/20/2018 0639   BUN 64 (H) 10/20/2018 0639   CREATININE 5.78 (H) 10/20/2018 0639   CREATININE 1.05 04/04/2018 0957   CALCIUM 9.0 10/20/2018 0639   GFRNONAA 9 (L) 10/20/2018 0639   GFRAA 10 (L) 10/20/2018 0639   CBC    Component Value Date/Time   WBC 4.7 10/19/2018 0919   RBC 2.58 (L) 10/19/2018 0919   HGB 7.9 (L) 10/19/2018 0919   HCT 26.1 (L) 10/19/2018 0919   PLT 54 (L) 10/19/2018 0919   MCV 101.2 (H) 10/19/2018 0919   MCH 30.6 10/19/2018 0919   MCHC 30.3 10/19/2018 0919   RDW 15.3 10/19/2018 0919   LYMPHSABS 0.5 (L) 10/16/2018 0516   MONOABS 0.4 10/16/2018 0516   EOSABS 0.1 10/16/2018 0516   BASOSABS 0.0 10/16/2018 0516     Assessment/Plan:  1. AKI/CKD stage 3- in setting of large volume paracentesis, concomitant ACE-inhibition, and worsening cirrhosis.  This is most consistent with hepatorenal syndrome but ischemic ATN is also on the DDx.  He is responding somewhat to midodrine and octreotide.  I had a lengthy discussion with Mr. Launer and his wife regarding his current condition.  We also discussed hemodialysis.  He is not a candidate for liver transplant and has a poor functional status at baseline.  I was quite honest that I do not believe that he would do well with HD and not sure he would even be able to tolerate it given his ascites and borderline BP.  Would not recommend Hd at this time.  His Cr appears to have reached a plateau and will continue to follow labs and clinical condition and discuss goals of care. 2. NASH- with recurrent large ascites and not candidate for transplant.  Would avoid any further paracentesis attempts for now and agree with Palliative Care consult.  Pt is now DNR and is discussing whether he  would even try HD if his renal function continues to worsen. 3. Anemia- started on Aranesp 4. hyperkalemia- improved 5. Metabolic acidosis on bicarb 6. Thrombocytopenia- due to Cirrhosis 7. Deconditioning- pt with poor functional status at baseline and has now worsened due acute illness and hospitalization.  He is DNR and has a poor overall prognosis.    Irena Cords, MD [employer] [phone]

## 2018-10-21 DIAGNOSIS — K7460 Unspecified cirrhosis of liver: Secondary | ICD-10-CM

## 2018-10-21 DIAGNOSIS — Z7189 Other specified counseling: Secondary | ICD-10-CM

## 2018-10-21 DIAGNOSIS — K7581 Nonalcoholic steatohepatitis (NASH): Secondary | ICD-10-CM

## 2018-10-21 LAB — RENAL FUNCTION PANEL
Albumin: 3 g/dL — ABNORMAL LOW (ref 3.5–5.0)
Anion gap: 6 (ref 5–15)
BUN: 62 mg/dL — AB (ref 8–23)
CALCIUM: 8.9 mg/dL (ref 8.9–10.3)
CO2: 18 mmol/L — ABNORMAL LOW (ref 22–32)
Chloride: 120 mmol/L — ABNORMAL HIGH (ref 98–111)
Creatinine, Ser: 5.68 mg/dL — ABNORMAL HIGH (ref 0.61–1.24)
GFR calc Af Amer: 10 mL/min — ABNORMAL LOW (ref >60)
GFR, EST NON AFRICAN AMERICAN: 9 mL/min — AB (ref >60)
GLUCOSE: 164 mg/dL — AB (ref 70–99)
Phosphorus: 4.1 mg/dL (ref 2.5–4.6)
Potassium: 4.5 mmol/L (ref 3.5–5.1)
SODIUM: 144 mmol/L (ref 135–145)

## 2018-10-21 LAB — CBC
HCT: 24.1 % — ABNORMAL LOW (ref 39.0–52.0)
HEMOGLOBIN: 7.6 g/dL — AB (ref 13.0–17.0)
MCH: 31.3 pg (ref 26.0–34.0)
MCHC: 31.5 g/dL (ref 30.0–36.0)
MCV: 99.2 fL (ref 80.0–100.0)
PLATELETS: 38 10*3/uL — AB (ref 150–400)
RBC: 2.43 MIL/uL — AB (ref 4.22–5.81)
RDW: 15 % (ref 11.5–15.5)
WBC: 4.2 10*3/uL (ref 4.0–10.5)
nRBC: 0 % (ref 0.0–0.2)

## 2018-10-21 LAB — GLUCOSE, CAPILLARY
GLUCOSE-CAPILLARY: 134 mg/dL — AB (ref 70–99)
Glucose-Capillary: 170 mg/dL — ABNORMAL HIGH (ref 70–99)

## 2018-10-21 LAB — AMMONIA: AMMONIA: 93 umol/L — AB (ref 9–35)

## 2018-10-21 MED ORDER — ALPRAZOLAM 0.25 MG PO TABS
0.2500 mg | ORAL_TABLET | Freq: Three times a day (TID) | ORAL | Status: DC | PRN
  Administered 2018-10-21: 0.25 mg via ORAL
  Filled 2018-10-21: qty 1

## 2018-10-21 MED ORDER — MORPHINE SULFATE (CONCENTRATE) 10 MG/0.5ML PO SOLN: 5.0000 mg | ORAL | Status: DC | PRN

## 2018-10-21 MED ORDER — HYDROMORPHONE HCL 1 MG/ML PO LIQD
1.0000 mg | ORAL | Status: DC | PRN
  Administered 2018-10-22: 1 mg via ORAL
  Filled 2018-10-21: qty 1

## 2018-10-21 NOTE — H&P (Addendum)
Chief Complaint: Recurrent ascites  Referring Physician(s): Kathlen Mody  Supervising Physician: Oley Balm  Patient Status: [facility] - In-pt  History of Present Illness: Larry Hayes is a 72 y.o. 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  He is not a candidate for liver transplant and has a poor functional status at baseline.   He is not a candidate for hemodialysis either.  He was seen by Hospice and palliative care and now the plan is discharge to Hospice.  We are asked to place a tunneled peritoneal catheter.  Past Medical History:  Diagnosis Date  . Anemia   . Anxiety   . Arthritis   . Diabetes (HCC)   . GERD (gastroesophageal reflux disease)   . 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 High cholesterol   . HOH (hard of hearing)   . Hypertension   . Sleep apnea   . Thrombocytopenia (HCC) 08/21/2016    Past Surgical History:  Procedure Laterality Date  . CATARACT EXTRACTION Right 11/2016   also had left eye done  . ESOPHAGEAL BANDING N/A 04/07/2018   Procedure: ESOPHAGEAL BANDING;  Surgeon: Malissa Hippo, MD;  Location: [facility];  Service: Endoscopy;  Laterality: N/A;  . ESOPHAGOGASTRODUODENOSCOPY N/A 08/01/2016   Procedure: ESOPHAGOGASTRODUODENOSCOPY (EGD);  Surgeon: Malissa Hippo, MD;  Location: [facility];  Service: Endoscopy;  Laterality: N/A;  2:45  . ESOPHAGOGASTRODUODENOSCOPY (EGD) WITH PROPOFOL N/A 04/07/2018   Procedure: ESOPHAGOGASTRODUODENOSCOPY (EGD) WITH PROPOFOL;  Surgeon: Malissa Hippo, MD;  Location: [facility];  Service: Endoscopy;  Laterality: N/A;  . HERNIA REPAIR     bilateral inguinal age 88  . KNEE SURGERY     rt arthroscopy    Allergies: Patient has no known allergies.  Medications: Prior to Admission medications   Medication Sig Start Date End Date Taking? Authorizing Provider  aspirin EC 81 MG tablet Take 81 mg by mouth daily.   Yes [provider]    atorvastatin (LIPITOR) 80 MG tablet Take 80 mg by mouth at bedtime.   Yes [provider]  furosemide (LASIX) 40 MG tablet Take 1 tablet (40 mg total) by mouth daily. 07/03/18  Yes Setzer, Terri L, NP  insulin glargine (LANTUS) 100 UNIT/ML injection Inject 40 Units into the skin at bedtime.    Yes [provider]  lisinopril (PRINIVIL,ZESTRIL) 5 MG tablet Take 5 mg by mouth daily.   Yes [provider]  metoprolol succinate (TOPROL-XL) 25 MG 24 hr tablet Take 25 mg by mouth daily.   Yes [provider]  Multiple Vitamins-Minerals (MULTIVITAMIN PO) Take 1 tablet by mouth daily.   Yes [provider]  omeprazole (PRILOSEC) 20 MG capsule Take 1 capsule (20 mg total) by mouth daily. 04/01/18  Yes Rehman, Joline Maxcy, MD  spironolactone (ALDACTONE) 50 MG tablet Take 2 tablets (100 mg total) by mouth daily. 10/03/17  Yes Rehman, Joline Maxcy, MD  VELTASSA 16.8 g PACK Take 16.8 g by mouth daily. Dissolve 1 packet in 1/3 cup of water and drink daily. 10/09/18  Yes [provider]  isosorbide dinitrate (ISORDIL) 30 MG tablet Take 30 mg by mouth 4 (four) times daily.    [provider]  lactulose (CHRONULAC) 10 GM/15ML solution Take 15 mLs (10 g total) by mouth 2 (two) times daily. Patient not taking: Reported on 10/16/2018 04/01/18   Malissa Hippo, MD  metFORMIN (GLUCOPHAGE) 1000 MG tablet Take 1,000 mg by mouth 2 (  two) times daily with a meal.     [provider]  Multiple Vitamin (STRESS B PO) Take 1 tablet by mouth daily.    [provider]  sertraline (ZOLOFT) 50 MG tablet Take 50 mg by mouth daily.  07/12/17   [provider]     Family History  Problem Relation Age of Onset  . Obesity Son     Social History   Socioeconomic History  . Marital status: Married    Spouse name: Not on file  . Number of children: Not on file  . Years of education: Not on file  . Highest education level: Not on file  Occupational  History  . Not on file  Social Needs  . Financial resource strain: Not on file  . Food insecurity:    Worry: Not on file    Inability: Not on file  . Transportation needs:    Medical: Not on file    Non-medical: Not on file  Tobacco Use  . Smoking status: Former Smoker    Packs/day: 1.50    Types: Cigarettes    Last attempt to quit: 05/23/2001    Years since quitting: 17.4  . Smokeless tobacco: Never Used  Substance and Sexual Activity  . Alcohol use: No    Alcohol/week: 0.0 standard drinks  . Drug use: No  . Sexual activity: Yes    Birth control/protection: None  Lifestyle  . Physical activity:    Days per week: Not on file    Minutes per session: Not on file  . Stress: Not on file  Relationships  . Social connections:    Talks on phone: Not on file    Gets together: Not on file    Attends religious service: Not on file    Active member of club or organization: Not on file    Attends meetings of clubs or organizations: Not on file    Relationship status: Not on file  Other Topics Concern  . Not on file  Social History Narrative  . Not on file     Review of Systems: A 12 point ROS discussed and pertinent positives are indicated in the HPI above.  All other systems are negative.  Review of Systems  Vital Signs: BP (!) 116/55 (BP Location: Right Arm)   Pulse 60   Temp 98 F (36.7 C) (Oral)   Resp 18   Wt 99.7 kg   SpO2 99%   BMI 32.46 kg/m   Physical Exam  Constitutional: He is oriented to person, place, and time. He appears well-developed.  HENT:  Head: Normocephalic and atraumatic.  Cardiovascular: Normal rate, regular rhythm and normal heart sounds.  Pulmonary/Chest: Effort normal and breath sounds normal.  Abdominal: He exhibits distension.  Musculoskeletal: Normal range of motion.  Neurological: He is oriented to person, place, and time.  Skin: Skin is warm and dry.  Psychiatric: He has a normal mood and affect. His behavior is normal. Judgment  and thought content normal.  Vitals reviewed.   Imaging: US Paracentesis  Result Date: 10/19/2018 INDICATION: History of NASH, liver cirrhosis and recurrent ascites requiring frequent paracenteses. Request for therapeutic paracentesis with 3 liter maximum. EXAM: ULTRASOUND GUIDED THERAPEUTIC PARACENTESIS MEDICATIONS: 10 mL 1% lidocaine COMPLICATIONS: None immediate. PROCEDURE: Informed written consent was obtained from the patient after a discussion of the risks, benefits and alternatives to treatment. A timeout was performed prior to the initiation of the procedure. Initial ultrasound scanning demonstrates a large amount of ascites within  the right lower abdominal quadrant. The right lower abdomen was prepped and draped in the usual sterile fashion. 1% lidocaine was used for local anesthesia. Following this, a 19 gauge, 10-cm, Yueh catheter was introduced. An ultrasound image was saved for documentation purposes. The paracentesis was performed. The catheter was removed and a dressing was applied. The patient tolerated the procedure well without immediate post procedural complication. FINDINGS: A total of approximately 3.0 L of clear yellow fluid was removed which was requested maximum. Residual fluid remains in abdomen. IMPRESSION: Successful ultrasound-guided paracentesis yielding 3.0 liters of peritoneal fluid. Read by Lynnette Caffey, PA-C Electronically Signed   By: Malachy Moan M.D.   On: 10/19/2018 11:11   US Paracentesis  Result Date: 10/03/2018 INDICATION: Cirrhosis, ascites EXAM: ULTRASOUND GUIDED DIAGNOSTIC AND THERAPEUTIC PARACENTESIS MEDICATIONS: None. COMPLICATIONS: None immediate. PROCEDURE: Procedure, benefits, and risks of procedure were discussed with patient. Written informed consent for procedure was obtained. Time out protocol followed. Adequate collection of ascites localized by ultrasound in RIGHT lower quadrant. Skin prepped and draped in usual sterile fashion. Skin and  soft tissues anesthetized with 10 mL of 1% lidocaine. 5 [place] Yueh catheter placed into peritoneal cavity. 8.5 L of yellow ascitic fluid aspirated by vacuum bottle suction. Procedure tolerated well by patient without immediate complication. FINDINGS: A total of approximately 8.5 L of ascitic fluid was removed. Samples were sent to the laboratory as requested by the clinical team. IMPRESSION: Successful ultrasound-guided paracentesis yielding 8.5 liters of peritoneal fluid. Electronically Signed   By: Ulyses Southward M.D.   On: 10/03/2018 12:44    Labs:  CBC: Recent Labs    10/17/18 0827 10/18/18 0436 10/19/18 0919 10/21/18 0527  WBC 3.2* 3.1* 4.7 4.2  HGB 7.9* 7.2* 7.9* 7.6*  HCT 25.1* 23.4* 26.1* 24.1*  PLT 30* 29* 54* 38*    COAGS: Recent Labs    04/04/18 0957 10/16/18 0516  INR 1.2* 1.69    BMP: Recent Labs    10/18/18 0436 10/19/18 0919 10/20/18 0639 10/21/18 0527  NA 138 138 142 144  K 5.1 5.4* 4.6 4.5  CL 116* 116* 118* 120*  CO2 15* 16* 18* 18*  GLUCOSE 198* 184* 146* 164*  BUN 65* 65* 64* 62*  CALCIUM 8.5* 8.8* 9.0 8.9  CREATININE 5.65* 5.75* 5.78* 5.68*  GFRNONAA 9* 9* 9* 9*  GFRAA 10* 10* 10* 10*    LIVER FUNCTION TESTS: Recent Labs    04/04/18 0957 07/03/18 1301  10/16/18 0516 10/17/18 0827 10/18/18 0436 10/19/18 0919 10/21/18 0527  BILITOT 1.4* 1.4*  --  1.1  --   --  1.8*  --   AST 48* 42*  --  62*  --   --  53*  --   ALT 24 19  --  36  --   --  20  --   ALKPHOS  --   --   --  115  --   --  80  --   PROT 6.0* 6.5  --  5.2*  --   --  5.1*  --   ALBUMIN  --   --    < > 2.3* 3.0* 3.4* 3.1* 3.0*   < > = values in this interval not displayed.    TUMOR MARKERS: Recent Labs    04/04/18 0957  AFPTM 2.1    Assessment and Plan:  Recurrent large volume ascites secondary to NASH cirrhosis and hepato-renal syndrome.  Plan is to discharge to Hospice.  Will proceed with placement of  tunneled peritoneal catheter today by Dr. Deanne Coffer.  Risks and  benefits discussed with the patient including bleeding, infection, damage to adjacent structures, malfunction of the catheter with need for additional procedures.  All of the patient's questions were answered, patient is agreeable to proceed. Consent signed and in chart.  Thank you for this interesting consult.  I greatly enjoyed meeting Larry Hayes and look forward to participating in their care.  A copy of this report was sent to the requesting provider on this date.  Electronically Signed: Gwynneth Macleod, PA-C   10/21/2018, 4:14 PM      I spent a total of 20 Minutes in face to face in clinical consultation, greater than 50% of which was counseling/coordinating care for peritoneal pleurX

## 2018-10-21 NOTE — Progress Notes (Signed)
PROGRESS NOTE    Larry Hayes  LNL:[id number] DOB: 1946/06/13 DOA: 10/15/2018 PCP: Olena Mater, MD    Brief Narrative: Larry Hayes a 72 y.o.malewith medical history significant forNASH progressed to cirrhosis with ascites, insulin-dependent diabetes mellitus, hypertension, and chronic anemia and thrombocytopenia, now presenting to the emergency department at the direction of his nephrologist for evaluation of worsening renal function. Upon arrival to the ED, patient is found to be afebrile, saturating well on room air, and with vitals otherwise normal. EKG features a sinus rhythm with low voltage QRS. Chest x-ray features low volume lungs without any acute finding. CT of the abdomen and pelvis is negative for hydronephrosis or obstructive uropathy, notable for punctate stones in the lower pole the left kidney, cirrhosis with splenomegaly and upper abdominal varices, and massive ascites. He was transferred to [facility] for evaluation of ARF for possible hepatorenal syndrome.    Assessment & Plan:   Principal Problem:   Acute renal failure (ARF) (HCC) Active Problems:   Insulin-requiring or dependent type II diabetes mellitus (HCC)   Macrocytic anemia   Hepatic cirrhosis (HCC)   Thrombocytopenia (HCC)   Metabolic acidosis   Acute renal failure (HCC)   Palliative care encounter   Liver cirrhosis secondary to NASH ([place])   Encounter for hospice care discussion   Acute renal failure differentials include hepatorenal syndrome versus progressive renal disease:  No improvement in renal para meters even after IV albumin, midodrine and octreotide. Nephrology consulted, following the patient. Not a candidate for HD. Palliative care consulted and discussions are underway.  After further discussion with the patient and wife , they have opted for full comfort measures and would liek to go home with hospice after peritoneal drain for palliative purposes for recurrent ascites.  We will continue to  hold his diuretics ACE inhibitor are and avoid other nephrotoxins at this time.    Mild metabolic acidosis; not much improvement. resume sodium bicarbonate   Liver cirrhosis with ascites and portal hypertension : US paracentesis on 11/9, 3l it taken out. IR requested for peritoneal drain for recurrent ascites for palliative care purposes.  Pt appears lethargic, get ammonia levels obtained.    Anemia of chronic disease probably secondary to liver cirrhosis.   Transfuse to keep hemoglobin greater than 7 Hemoglobin of 7.6    Thrombocytopenia probably from liver cirrhosis  Platelets are 38,000    No signs of overt bleeding.   Insulin-dependent diabetes mellitus CBG (last 3)  Recent Labs    10/20/18 2117 10/21/18 0732 10/21/18 1151  GLUCAP 160* 134* 170*   No change in medications.    DVT prophylaxis: SCDs Code Status: DNR.  Family Communication: Discussed with wife at bedside Disposition Plan home with home hospice.   Consultants:   Nephrology  Palliative care   Procedures: Ultrasound paracentesis   Antimicrobials: None   Subjective: LETHARGIC. Wants to go home as soon as possible.    Objective: Vitals:   10/20/18 2113 10/21/18 0546 10/21/18 0733 10/21/18 1631  BP: 139/66 (!) 124/54 (!) 116/55 136/62  Pulse: 65 61 60 60  Resp: 18 20 18 18   Temp: 97.7 F (36.5 C) 97.8 F (36.6 C) 98 F (36.7 C) (!) 97.5 F (36.4 C)  TempSrc: Oral Oral Oral Oral  SpO2: 99% 99% 99% 98%  Weight:        Intake/Output Summary (Last 24 hours) at 10/21/2018 1800 Last data filed at 10/21/2018 1607 Gross per 24 hour  Intake 1120.47 ml  Output 400 ml  Net  720.47 ml   Filed Weights   10/16/18 2003 10/18/18 2102  Weight: 99.8 kg 99.7 kg    Examination:  General exam: mild distress from abdominal distention.  Respiratory system: diminished air entry at bases, no wheezing or rhonchi.   cardiovascular system: S1 & S2 heard, RRR.  Faint murmur, trace pedal  edema Gastrointestinal system: Abdomen is distended, non tender non distended bowel sounds good.  Central nervous system: sleepy, orientedto per son , place. Lethargic.  Extremities: No cyanosis or clubbing  pedal edema Skin: No rashes, lesions or ulcers Psychiatry: . Mood & affect appropriate.     Data Reviewed: I have personally reviewed following labs and imaging studies CBC: Recent Labs  Lab 10/16/18 0516 10/17/18 0827 10/18/18 0436 10/19/18 0919 10/21/18 0527  WBC 4.3 3.2* 3.1* 4.7 4.2  NEUTROABS 3.2  --   --   --   --   HGB 8.2* 7.9* 7.2* 7.9* 7.6*  HCT 26.2* 25.1* 23.4* 26.1* 24.1*  MCV 100.8* 100.8* 101.7* 101.2* 99.2  PLT 39* 30* 29* 54* 38*   Basic Metabolic Panel: Recent Labs  Lab 10/17/18 0827 10/18/18 0436 10/19/18 0919 10/20/18 0639 10/21/18 0527  NA 139 138 138 142 144  K 5.2* 5.1 5.4* 4.6 4.5  CL 117* 116* 116* 118* 120*  CO2 16* 15* 16* 18* 18*  GLUCOSE 76 198* 184* 146* 164*  BUN 69* 65* 65* 64* 62*  CREATININE 5.58* 5.65* 5.75* 5.78* 5.68*  CALCIUM 8.8* 8.5* 8.8* 9.0 8.9  PHOS 5.0* 4.9*  --   --  4.1   GFR: Estimated Creatinine Clearance: 13.3 mL/min (A) (by C-G formula based on SCr of 5.68 mg/dL (H)). Liver Function Tests: Recent Labs  Lab 10/16/18 0516 10/17/18 0827 10/18/18 0436 10/19/18 0919 10/21/18 0527  AST 62*  --   --  53*  --   ALT 36  --   --  20  --   ALKPHOS 115  --   --  80  --   BILITOT 1.1  --   --  1.8*  --   PROT 5.2*  --   --  5.1*  --   ALBUMIN 2.3* 3.0* 3.4* 3.1* 3.0*   No results for input(s): LIPASE, AMYLASE in the last 168 hours. Recent Labs  Lab 10/21/18 1114  AMMONIA 93*   Coagulation Profile: Recent Labs  Lab 10/16/18 0516  INR 1.69   Cardiac Enzymes: No results for input(s): CKTOTAL, CKMB, CKMBINDEX, TROPONINI in the last 168 hours. BNP (last 3 results) No results for input(s): PROBNP in the last 8760 hours. HbA1C: No results for input(s): HGBA1C in the last 72 hours. CBG: Recent Labs  Lab  10/20/18 1117 10/20/18 1619 10/20/18 2117 10/21/18 0732 10/21/18 1151  GLUCAP 158* 146* 160* 134* 170*   Lipid Profile: No results for input(s): CHOL, HDL, LDLCALC, TRIG, CHOLHDL, LDLDIRECT in the last 72 hours. Thyroid Function Tests: No results for input(s): TSH, T4TOTAL, FREET4, T3FREE, THYROIDAB in the last 72 hours. Anemia Panel: No results for input(s): VITAMINB12, FOLATE, FERRITIN, TIBC, IRON, RETICCTPCT in the last 72 hours. Sepsis Labs: No results for input(s): PROCALCITON, LATICACIDVEN in the last 168 hours.  No results found for this or any previous visit (from the past 240 hour(s)).       Radiology Studies: No results found.      Scheduled Meds: . insulin glargine  10 Units Subcutaneous QHS  . lactulose  10 g Oral BID  . midodrine  10 mg Oral TID WC  .  pantoprazole  40 mg Oral Daily  . sodium bicarbonate  1,300 mg Oral TID  . sodium chloride flush  3 mL Intravenous Q12H   Continuous Infusions: . sodium chloride    . octreotide  (SANDOSTATIN)    IV infusion 50 mcg/hr (10/21/18 0741)     LOS: 6 days    Time spent: 34 minutes    Hosie Poisson, MD [facility] Pager [phone] If 7PM-7AM, please contact night-coverage [email or web address] Password [email or web address] 10/21/2018, 6:00 PM

## 2018-10-21 NOTE — Progress Notes (Signed)
S: No events overnight and is without complaints O:BP (!) 116/55 (BP Location: Right Arm)   Pulse 60   Temp 98 F (36.7 C) (Oral)   Resp 18   Wt 99.7 kg   SpO2 99%   BMI 32.46 kg/m   Intake/Output Summary (Last 24 hours) at 10/21/2018 1316 Last data filed at 10/21/2018 1259 Gross per 24 hour  Intake 1432.14 ml  Output 850 ml  Net 582.14 ml   Intake/Output: I/O last 3 completed shifts: In: 1946.4 [P.O.:860; I.V.:1086.4] Out: 1550 [Urine:1550]  Intake/Output this shift:  Total I/O In: 534.6 [P.O.:360; I.V.:174.6] Out: 400 [Urine:400] Weight change:  Gen: weak, chronically ill-appearing WM lying in bed in NAD CVS: no rub Resp: decreased BS at bases Abd: distended, +BS, +fluid wave, NT Ext: 1+ edema Neuro: + asterixis  Recent Labs  Lab 10/16/18 0516 10/17/18 0827 10/18/18 0436 10/19/18 0919 10/20/18 0639 10/21/18 0527  NA 137 139 138 138 142 144  K 5.5* 5.2* 5.1 5.4* 4.6 4.5  CL 117* 117* 116* 116* 118* 120*  CO2 13* 16* 15* 16* 18* 18*  GLUCOSE 177* 76 198* 184* 146* 164*  BUN 71* 69* 65* 65* 64* 62*  CREATININE 5.68* 5.58* 5.65* 5.75* 5.78* 5.68*  ALBUMIN 2.3* 3.0* 3.4* 3.1*  --  3.0*  CALCIUM 8.4* 8.8* 8.5* 8.8* 9.0 8.9  PHOS  --  5.0* 4.9*  --   --  4.1  AST 62*  --   --  53*  --   --   ALT 36  --   --  20  --   --    Liver Function Tests: Recent Labs  Lab 10/16/18 0516  10/18/18 0436 10/19/18 0919 10/21/18 0527  AST 62*  --   --  53*  --   ALT 36  --   --  20  --   ALKPHOS 115  --   --  80  --   BILITOT 1.1  --   --  1.8*  --   PROT 5.2*  --   --  5.1*  --   ALBUMIN 2.3*   < > 3.4* 3.1* 3.0*   < > = values in this interval not displayed.   No results for input(s): LIPASE, AMYLASE in the last 168 hours. Recent Labs  Lab 10/21/18 1114  AMMONIA 93*   CBC: Recent Labs  Lab 10/16/18 0516 10/17/18 0827 10/18/18 0436 10/19/18 0919 10/21/18 0527  WBC 4.3 3.2* 3.1* 4.7 4.2  NEUTROABS 3.2  --   --   --   --   HGB 8.2* 7.9* 7.2* 7.9* 7.6*   HCT 26.2* 25.1* 23.4* 26.1* 24.1*  MCV 100.8* 100.8* 101.7* 101.2* 99.2  PLT 39* 30* 29* 54* 38*   Cardiac Enzymes: No results for input(s): CKTOTAL, CKMB, CKMBINDEX, TROPONINI in the last 168 hours. CBG: Recent Labs  Lab 10/20/18 1117 10/20/18 1619 10/20/18 2117 10/21/18 0732 10/21/18 1151  GLUCAP 158* 146* 160* 134* 170*    Iron Studies: No results for input(s): IRON, TIBC, TRANSFERRIN, FERRITIN in the last 72 hours. Studies/Results: No results found. . insulin aspart  0-5 Units Subcutaneous QHS  . insulin aspart  0-9 Units Subcutaneous TID WC  . insulin glargine  10 Units Subcutaneous QHS  . lactulose  10 g Oral BID  . midodrine  10 mg Oral TID WC  . pantoprazole  40 mg Oral Daily  . sodium bicarbonate  1,300 mg Oral TID  . sodium chloride flush  3 mL  Intravenous Q12H    BMET    Component Value Date/Time   NA 144 10/21/2018 0527   K 4.5 10/21/2018 0527   CL 120 (H) 10/21/2018 0527   CO2 18 (L) 10/21/2018 0527   GLUCOSE 164 (H) 10/21/2018 0527   BUN 62 (H) 10/21/2018 0527   CREATININE 5.68 (H) 10/21/2018 0527   CREATININE 1.05 04/04/2018 0957   CALCIUM 8.9 10/21/2018 0527   GFRNONAA 9 (L) 10/21/2018 0527   GFRAA 10 (L) 10/21/2018 0527   CBC    Component Value Date/Time   WBC 4.2 10/21/2018 0527   RBC 2.43 (L) 10/21/2018 0527   HGB 7.6 (L) 10/21/2018 0527   HCT 24.1 (L) 10/21/2018 0527   PLT 38 (L) 10/21/2018 0527   MCV 99.2 10/21/2018 0527   MCH 31.3 10/21/2018 0527   MCHC 31.5 10/21/2018 0527   RDW 15.0 10/21/2018 0527   LYMPHSABS 0.5 (L) 10/16/2018 0516   MONOABS 0.4 10/16/2018 0516   EOSABS 0.1 10/16/2018 0516   BASOSABS 0.0 10/16/2018 0516     Assessment/Plan:  1. AKI/CKD stage 3- in setting of large volume paracentesis, concomitant ACE-inhibition, and worsening cirrhosis.  This is most consistent with hepatorenal syndrome but ischemic ATN is also on the DDx.  He is responding somewhat to midodrine and octreotide.  I had a lengthy discussion  with Larry Hayes and his wife regarding his current condition.  We also discussed hemodialysis.  He is not a candidate for liver transplant and has a poor functional status at baseline.  I was quite honest that I do not believe that he would do well with HD and not sure he would even be able to tolerate it given his ascites and borderline BP.  Would not recommend Hd at this time.  His Cr appears to have reached a plateau to slightly improved and will continue to follow labs and clinical condition and discuss goals of care. 2. NASH- with recurrent large ascites and not candidate for transplant.  Would avoid any further paracentesis attempts for now and agree with Palliative Care consult.  Pt is now DNR and is discussing whether he would even try HD if his renal function continues to worsen.  I do not think that this would improve his survival or overall quality of life due to his recurrent ascites and cirrhosis.  Will meet with the family later today to further discuss GOC.  He admits that he is ready to die and is not afraid. 3. Anemia- started on Aranesp 4. hyperkalemia- improved 5. Metabolic acidosis on bicarb 6. Thrombocytopenia- due to Cirrhosis 7. Deconditioning- pt with poor functional status at baseline and has now worsened due acute illness and hospitalization.  He is DNR and has a poor overall prognosis.  Irena Cords, MD [employer] [phone]

## 2018-10-21 NOTE — Progress Notes (Addendum)
Daily Progress Note   Patient Name: Larry Hayes       Date: 10/21/2018 DOB: June 30, 1946  Age: 72 y.o. MRN#: [id number] Attending Physician: Kathlen Mody, MD Primary Care Physician: Christena Flake, MD Admit Date: 10/15/2018  Reason for Consultation/Follow-up: Establishing goals of care and Psychosocial/spiritual support  Subjective: Spoke with patient and wife at bedside.  Dr. Arrie Aran came in for a period of time as well.  Decisions were made for no hemodialysis.  Husband wants to go home with hospice services [facility]).  He wants to die at home "in my bed".  Discussed at length the process of dying and symptom control.  Discussed hospice services at home as well as inpatient hospice.    Family would like a peritoneal cath placed to enable hospice to perform small frequent "pericentesis" at home. Nephrology recommends smaller amounts be taken off in order to protect the kidney function.  We recommend they drain enough to make Larry Hayes comfortable - relieve pain and dyspnea.   Assessment: Patient with advanced NASH cirrhosis, requiring frequent large volume paracentesis.  Now in renal failure as well.  This hospitalization has not improved his renal function.   Patient Profile/HPI:  72 y.o. 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  Patient was transferred from outside hospital for additional work-up and management.  Palliative care was consulted to help address goals of care.  Length of Stay: 6  Current Medications: Scheduled  Meds:  . insulin aspart  0-5 Units Subcutaneous QHS  . insulin aspart  0-9 Units Subcutaneous TID WC  . insulin glargine  10 Units Subcutaneous QHS  . lactulose  10 g Oral BID  . midodrine  10 mg Oral TID WC  . pantoprazole  40 mg Oral Daily  . sodium bicarbonate  1,300 mg Oral TID  . sodium chloride flush  3 mL Intravenous Q12H    Continuous Infusions: . sodium chloride    . octreotide  (SANDOSTATIN)    IV infusion 50 mcg/hr (10/21/18 0741)    PRN Meds: sodium chloride, ondansetron (ZOFRAN) IV, promethazine, sodium chloride flush  Physical Exam  Very pleasant well developed male, awake, alert, appears fatigued, coherent but sometimes mumbles. CV rrr resp no distress Abdomen distended (fluid filled) but soft Lower ext 2+ edema  Vital Signs: BP (!) 116/55 (BP Location: Right Arm)   Pulse 60   Temp 98 F (36.7 C) (Oral)   Resp 18   Wt 99.7 kg   SpO2 99%   BMI 32.46 kg/m  SpO2: SpO2: 99 % O2 Device: O2 Device: Room Air O2 Flow Rate:    Intake/output summary:   Intake/Output Summary (Last 24 hours) at 10/21/2018 0958 Last data filed at 10/21/2018 0845 Gross per 24 hour  Intake 2020.63 ml  Output 1450 ml  Net 570.63 ml   LBM: Last BM Date: 10/17/18 Baseline Weight: Weight: 99.8 kg Most recent weight: Weight: 99.7 kg       Palliative Assessment/Data: 40%     Patient Active Problem List   Diagnosis Date Noted  . Palliative care encounter   . Acute renal failure (ARF) (HCC) 10/16/2018  . Hyperkalemia 10/16/2018  . Metabolic acidosis 10/16/2018  . Acute renal failure (HCC) 10/16/2018  . Other cirrhosis of liver (HCC) 04/01/2018  . Other ascites 04/01/2018  . 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 Thrombocytopenia (HCC) 08/21/2016  . Iron deficiency anemia due to chronic blood loss 07/31/2016  . Guaiac positive stools 07/31/2016  . Insulin-requiring or dependent type II diabetes mellitus (HCC) 05/23/2016  . High cholesterol 05/23/2016  . Macrocytic  anemia 05/23/2016    Palliative Care Plan    Recommendations/Plan:  Meet with sons to explain the plan.    After meeting with sons - proceed with Pleurx Cath placement in the abdomen for paracentesis at home.  Home with Hospice - patient will need Abdominal Cath drainage, as well as potentially oxygen for comfort.  Goals of Care and Additional Recommendations:  Limitations on Scope of Treatment: Full Comfort Care (after meeting with sons)  Symptom Management:  Pleurex Cath placement to drain ascites  Low dose xanax for anxiety  Low dose dilaudid for dyspnea or pain (works better than morphine in renal patients)  Code Status:  DNR  Prognosis:   < 6 weeks - could be much quicker given liver failure, renal failure and hypotension.   Discharge Planning:  Home with Hospice  Care plan was discussed with Nephrology, TRH MD, family  Thank you for allowing the Palliative Medicine Team to assist in the care of this patient.  Total time spent:  60 min Time in 9:00 Time out 10:00 Time in:  2:15 Time out: 2:45     Greater than 50%  of this time was spent counseling and coordinating care related to the above assessment and plan.  Norvel RichardsMarianne Shebra Muldrow, PA-C Palliative Medicine  Please contact Palliative MedicineTeam phone at [phone] for questions and concerns between 7 am - 7 pm.   Please see AMION for individual provider pager numbers.

## 2018-10-22 ENCOUNTER — Inpatient Hospital Stay (HOSPITAL_COMMUNITY): Payer: Medicare Other

## 2018-10-22 ENCOUNTER — Encounter (HOSPITAL_COMMUNITY): Payer: Self-pay | Admitting: Physician Assistant

## 2018-10-22 DIAGNOSIS — E872 Acidosis: Secondary | ICD-10-CM

## 2018-10-22 DIAGNOSIS — K7031 Alcoholic cirrhosis of liver with ascites: Secondary | ICD-10-CM

## 2018-10-22 DIAGNOSIS — D539 Nutritional anemia, unspecified: Secondary | ICD-10-CM

## 2018-10-22 DIAGNOSIS — D696 Thrombocytopenia, unspecified: Secondary | ICD-10-CM

## 2018-10-22 DIAGNOSIS — K7581 Nonalcoholic steatohepatitis (NASH): Secondary | ICD-10-CM

## 2018-10-22 DIAGNOSIS — K7460 Unspecified cirrhosis of liver: Secondary | ICD-10-CM

## 2018-10-22 DIAGNOSIS — N179 Acute kidney failure, unspecified: Principal | ICD-10-CM

## 2018-10-22 HISTORY — PX: IR PERC TUN PERIT CATH WO PORT S&I /IMAG: IMG2327

## 2018-10-22 LAB — PROTIME-INR
INR: 2.03
Prothrombin Time: 22.7 seconds — ABNORMAL HIGH (ref 11.4–15.2)

## 2018-10-22 MED ORDER — FENTANYL CITRATE (PF) 100 MCG/2ML IJ SOLN
INTRAMUSCULAR | Status: AC | PRN
  Administered 2018-10-22 (×2): 25 ug via INTRAVENOUS

## 2018-10-22 MED ORDER — MIDAZOLAM HCL 2 MG/2ML IJ SOLN
INTRAMUSCULAR | Status: AC | PRN
  Administered 2018-10-22 (×2): 0.5 mg via INTRAVENOUS

## 2018-10-22 MED ORDER — LIDOCAINE HCL (PF) 1 % IJ SOLN
INTRAMUSCULAR | Status: AC | PRN
  Administered 2018-10-22: 10 mL

## 2018-10-22 MED ORDER — MIDAZOLAM HCL 2 MG/2ML IJ SOLN
INTRAMUSCULAR | Status: AC
  Filled 2018-10-22: qty 2

## 2018-10-22 MED ORDER — FENTANYL CITRATE (PF) 100 MCG/2ML IJ SOLN
INTRAMUSCULAR | Status: AC
  Filled 2018-10-22: qty 2

## 2018-10-22 MED ORDER — CEFAZOLIN SODIUM-DEXTROSE 2-4 GM/100ML-% IV SOLN
2.0000 g | Freq: Once | INTRAVENOUS | Status: AC
  Administered 2018-10-22: 2 g via INTRAVENOUS

## 2018-10-22 MED ORDER — CEFAZOLIN SODIUM-DEXTROSE 2-4 GM/100ML-% IV SOLN
INTRAVENOUS | Status: AC
  Administered 2018-10-22: 2 g via INTRAVENOUS
  Filled 2018-10-22: qty 100

## 2018-10-22 NOTE — Progress Notes (Signed)
PROGRESS NOTE    Briana Newman  ZOX:[id number] DOB: 02-Aug-1946 DOA: 10/15/2018 PCP: Olena Mater, MD   Brief Narrative: Madox Corkins is a 72 y.o. male with a history of cirrhosis secondary to NASH, recurrent ascites, insulin-dependent diabetes, hypertension, chronic anemia, thrombocytopenia.  Patient presented secondary to worsening renal function and was found to be in renal failure.  Patient is not a hemodialysis candidate and eventually, recommendations were made for comfort care.  Patient is now set up with hospice at home.  He is status post Pleurx peritoneal catheter.   Assessment & Plan:   Principal Problem:   Acute renal failure (ARF) (HCC) Active Problems:   Insulin-requiring or dependent type II diabetes mellitus (HCC)   Macrocytic anemia   Hepatic cirrhosis (HCC)   Thrombocytopenia (HCC)   Metabolic acidosis   Acute renal failure (HCC)   Palliative care encounter   Liver cirrhosis secondary to NASH [facility])   Encounter for hospice care discussion   Acute renal failure Thought to be secondary to hepatorenal syndrome versus progressive renal disease.  Patient with decreased urine output.  Creatinine has stabilized.  Currently not a hemodialysis candidate.  Plans for home with hospice.  Hepatic ascites Cirrhosis secondary to [facility] Patient is status post peritoneal Pleurx for palliative management.  Patient also with elevated ammonia for which she is on lactulose.  Drain will be managed by hospice at home.  Thrombocytopenia Secondary to liver disease.  Patient is now comfort care on hospice.  Diabetes mellitus, type 2 Patient is on metformin and Lantus as an outpatient. -Discontinue metformin on discharge -Continue Lantus 10 units nightly  Anemia of chronic disease Hemoglobin currently stable.  In setting of cirrhosis.  Metabolic acidosis Secondary to kidney failure. -Continue sodium bicarbonate tablets    Pressure Injury Documentation:     DVT prophylaxis:  None, comfort care Code Status:   Code Status: DNR Family Communication: Wife at bedside Disposition Plan: Discharge home with hospice in 24 hours   Consultants:   Nephrology  Interventional radiology  Palliative care  Procedures:   11/13: Pleurx peritoneal tunneled cath placement  Antimicrobials:  None   Subjective: Patient with no current issues.  Somnolent.  Objective: Vitals:   10/22/18 1245 10/22/18 1311 10/22/18 1326 10/22/18 1733  BP: (!) 123/53 (!) 131/54 (!) 131/54 (!) 115/56  Pulse: 60 60 (!) 59 60  Resp: 11 13 18 18   Temp:   (!) 97.5 F (36.4 C) 97.8 F (36.6 C)  TempSrc:   Oral Oral  SpO2: 97% 99% 99% 98%  Weight:        Intake/Output Summary (Last 24 hours) at 10/22/2018 1951 Last data filed at 10/22/2018 1852 Gross per 24 hour  Intake 631.67 ml  Output 0 ml  Net 631.67 ml   Filed Weights   10/16/18 2003 10/18/18 2102 10/21/18 1900  Weight: 99.8 kg 99.7 kg 107.4 kg    Examination:  General exam: Appears calm and comfortable  Respiratory system: Clear to auscultation. Respiratory effort normal. Cardiovascular system: S1 & S2 heard, RRR. No murmurs, rubs, gallops or clicks. Gastrointestinal system: Abdomen is nondistended, soft and nontender. No organomegaly or masses felt. Normal bowel sounds heard. Central nervous system: Somnolent. Extremities: No edema. No calf tenderness Skin: No cyanosis. No rashes     Data Reviewed: I have personally reviewed following labs and imaging studies  CBC: Recent Labs  Lab 10/16/18 0516 10/17/18 0827 10/18/18 0436 10/19/18 0919 10/21/18 0527  WBC 4.3 3.2* 3.1* 4.7 4.2  NEUTROABS 3.2  --   --   --   --  HGB 8.2* 7.9* 7.2* 7.9* 7.6*  HCT 26.2* 25.1* 23.4* 26.1* 24.1*  MCV 100.8* 100.8* 101.7* 101.2* 99.2  PLT 39* 30* 29* 54* 38*   Basic Metabolic Panel: Recent Labs  Lab 10/17/18 0827 10/18/18 0436 10/19/18 0919 10/20/18 0639 10/21/18 0527  NA 139 138 138 142 144  K 5.2* 5.1 5.4* 4.6  4.5  CL 117* 116* 116* 118* 120*  CO2 16* 15* 16* 18* 18*  GLUCOSE 76 198* 184* 146* 164*  BUN 69* 65* 65* 64* 62*  CREATININE 5.58* 5.65* 5.75* 5.78* 5.68*  CALCIUM 8.8* 8.5* 8.8* 9.0 8.9  PHOS 5.0* 4.9*  --   --  4.1   GFR: Estimated Creatinine Clearance: 13.8 mL/min (A) (by C-G formula based on SCr of 5.68 mg/dL (H)). Liver Function Tests: Recent Labs  Lab 10/16/18 0516 10/17/18 0827 10/18/18 0436 10/19/18 0919 10/21/18 0527  AST 62*  --   --  53*  --   ALT 36  --   --  20  --   ALKPHOS 115  --   --  80  --   BILITOT 1.1  --   --  1.8*  --   PROT 5.2*  --   --  5.1*  --   ALBUMIN 2.3* 3.0* 3.4* 3.1* 3.0*   No results for input(s): LIPASE, AMYLASE in the last 168 hours. Recent Labs  Lab 10/21/18 1114  AMMONIA 93*   Coagulation Profile: Recent Labs  Lab 10/16/18 0516 10/22/18 0543  INR 1.69 2.03   Cardiac Enzymes: No results for input(s): CKTOTAL, CKMB, CKMBINDEX, TROPONINI in the last 168 hours. BNP (last 3 results) No results for input(s): PROBNP in the last 8760 hours. HbA1C: No results for input(s): HGBA1C in the last 72 hours. CBG: Recent Labs  Lab 10/20/18 1117 10/20/18 1619 10/20/18 2117 10/21/18 0732 10/21/18 1151  GLUCAP 158* 146* 160* 134* 170*   Lipid Profile: No results for input(s): CHOL, HDL, LDLCALC, TRIG, CHOLHDL, LDLDIRECT in the last 72 hours. Thyroid Function Tests: No results for input(s): TSH, T4TOTAL, FREET4, T3FREE, THYROIDAB in the last 72 hours. Anemia Panel: No results for input(s): VITAMINB12, FOLATE, FERRITIN, TIBC, IRON, RETICCTPCT in the last 72 hours. Sepsis Labs: No results for input(s): PROCALCITON, LATICACIDVEN in the last 168 hours.  No results found for this or any previous visit (from the past 240 hour(s)).       Radiology Studies: Ir Perc Athena Masse Perit Cath [facility]  Result Date: 10/22/2018 CLINICAL DATA:  Cirrhosis and refractory ascites. Request has been made to place a tunneled peritoneal drainage catheter  for palliative purposes. EXAM: INSERTION OF TUNNELED PERITONEAL DRAINAGE CATHETER ANESTHESIA/SEDATION: 1.0 mg IV Versed; 50 mcg IV Fentanyl. Total Moderate Sedation Time 19 minutes. The patient's level of consciousness and physiologic status were continuously monitored during the procedure by Radiology nursing. MEDICATIONS: 2 g IV Ancef. Antibiotic was administered in an appropriate time interval for the procedure. FLUOROSCOPY TIME:  Less than 6 seconds.  1.3 mGy. PROCEDURE: The procedure, risks, benefits, and alternatives were explained to the patient. Questions regarding the procedure were encouraged and answered. The patient understands and consents to the procedure. A time-out was performed prior to initiating the procedure. The right abdominal wall was prepped with chlorhexidine in a sterile fashion, and a sterile drape was applied covering the operative field. A sterile gown and sterile gloves were used for the procedure. Local anesthesia was provided with 1% Lidocaine. Ultrasound image documentation was performed. Fluoroscopy during the procedure and fluoroscopic spot  radiograph confirms appropriate catheter position. After creating a small skin incision, a 19 gauge needle was advanced into the peritoneal cavity under ultrasound guidance. A guide wire was then advanced under fluoroscopy into the peritoneal cavity. Peritoneal access was dilated serially and a 16-French peel-away sheath placed. A 15.5 French tunneled peritoneal PleurX catheter was placed. This was tunneled from an incision 5 cm below the peritoneal access to the access site. The catheter was advanced through the peel-away sheath. The sheath was then removed. Final catheter positioning was confirmed with a fluoroscopic spot image. The peritoneal access incision was closed with subcuticular 4-0 Vicryl. Dermabond was applied to the incision. A Prolene retention suture was applied at the catheter exit site. Large volume paracentesis was performed  through the new catheter utilizing drainage bottles. COMPLICATIONS: None. FINDINGS: The catheter was placed via the right abdominal wall. Catheter course is in the right lower quadrant. Approximately 7.3 liters of ascites was able to be removed after catheter placement. IMPRESSION: Placement of tunneled peritoneal drainage catheter via right abdominal approach. 7.3 liters of ascites was removed today after catheter placement. Electronically Signed   By: Aletta Edouard M.D.   On: 10/22/2018 14:15        Scheduled Meds: . insulin glargine  10 Units Subcutaneous QHS  . lactulose  10 g Oral BID  . midodrine  10 mg Oral TID WC  . pantoprazole  40 mg Oral Daily  . sodium bicarbonate  1,300 mg Oral TID   Continuous Infusions:   LOS: 7 days     Cordelia Poche, MD [facility] 10/22/2018, 7:51 PM  If 7PM-7AM, please contact night-coverage [email or web address]

## 2018-10-22 NOTE — Progress Notes (Signed)
Visited with patient and wife at bedside.  Patient is sleeping comfortably after IR procedure.  Examined catheter site - bandage clean and dry.  CV rrr, Resp no distress.  W[facility] very [facility].  She is looking forward to taking her husband home with the support of Hospice.  Norvel RichardsMarianne Alithia Zavaleta, PA-C Palliative Medicine Pager: [phone]    No charge note.

## 2018-10-22 NOTE — Progress Notes (Signed)
   10/22/18 1005  Clinical Encounter Type  Visited With Patient and family together  Visit Type Initial  Referral From Palliative care team  Consult/Referral To Chaplain  Spiritual Encounters  Spiritual Needs Prayer;Emotional  The chaplain responded to spiritual care consult from PMT.  The chaplain was welcomed into the room by the Pt. and Pt. wife -[name].  The Pt. recognizes and verbalizes his relationship with God.  The conversation with the Pt. and [name] expressed his desire to go home with Hospice.  As the Pt. described his desire to be at home, the wife was emotional but able to hold on to the role of coordinating the transition.  The Pt. openingly shared his love for his wife and family while expressing his EOL goals.  The spiritual care visit closed with prayer and an invitation for future spiritual care for the Pt. and caregiver.  The Pt. [name] arrived as the chaplain exited.

## 2018-10-22 NOTE — Care Management Note (Addendum)
Case Management Note  Patient Details  Name: Larry Hayes MRN: [id number] Date of Birth: 09-16-1946  Subjective/Objective:   NCM spoke with wife, Ardeth PerfectBrenda Craton, she was in patient room.  She states they would like to [facility] [place] for [place] hospice.  She states patient may need ambulance at dc and oxygen, she is not sure.  She states patient has a cane, walker and a bsc at home.  [facility]P is [facility].  NCM [phone] , fax [phone], will fax demo, h/p and palliative note .    11/13 12:15 NCM received call from Ronelle NighBren Hankins at [facility] stating patient has been approved and they will be getting patient DME with hospital bed.                  Action/Plan: NCM will follow for transition of care needs.   Expected Discharge Date:                  Expected Discharge Plan:  Home w Hospice Care  In-House Referral:  Hospice / Palliative Care  Discharge planning Services  CM Consult  Post Acute Care Choice:  Hospice Choice offered to:  Spouse  DME Arranged:    DME Agency:     HH Arranged:    HH Agency:  Other - See comment  Status of Service:  Completed, signed off  If discussed at Long Length of Stay Meetings, dates discussed:    Additional Comments:  Leone Havenaylor, Terez Montee Clinton, RN 10/22/2018, 9:36 AM

## 2018-10-22 NOTE — Progress Notes (Signed)
S: Pt s/p PleurX peritoneal tunneled cath placement. O:BP (!) 131/54 (BP Location: Left Arm)   Pulse (!) 59   Temp (!) 97.5 F (36.4 C) (Oral)   Resp 18   Wt 107.4 kg   SpO2 99%   BMI 34.97 kg/m   Intake/Output Summary (Last 24 hours) at 10/22/2018 1341 Last data filed at 10/22/2018 1257 Gross per 24 hour  Intake 765.42 ml  Output 0 ml  Net 765.42 ml   Intake/Output: I/O last 3 completed shifts: In: 1583.4 [P.O.:600; I.V.:983.4] Out: 400 [Urine:400]  Intake/Output this shift:  Total I/O In: 100 [IV Piggyback:100] Out: -  Weight change:  ZOX:WRUEA, WM resting comfortably in bed CVS: no rub Resp: decreased BS at bases Abd: distended, +BS, +fluid wave, Peritoneal cath in RLQ Ext: 1+ edema  Recent Labs  Lab 10/16/18 0516 10/17/18 0827 10/18/18 0436 10/19/18 0919 10/20/18 0639 10/21/18 0527  NA 137 139 138 138 142 144  K 5.5* 5.2* 5.1 5.4* 4.6 4.5  CL 117* 117* 116* 116* 118* 120*  CO2 13* 16* 15* 16* 18* 18*  GLUCOSE 177* 76 198* 184* 146* 164*  BUN 71* 69* 65* 65* 64* 62*  CREATININE 5.68* 5.58* 5.65* 5.75* 5.78* 5.68*  ALBUMIN 2.3* 3.0* 3.4* 3.1*  --  3.0*  CALCIUM 8.4* 8.8* 8.5* 8.8* 9.0 8.9  PHOS  --  5.0* 4.9*  --   --  4.1  AST 62*  --   --  53*  --   --   ALT 36  --   --  20  --   --    Liver Function Tests: Recent Labs  Lab 10/16/18 0516  10/18/18 0436 10/19/18 0919 10/21/18 0527  AST 62*  --   --  53*  --   ALT 36  --   --  20  --   ALKPHOS 115  --   --  80  --   BILITOT 1.1  --   --  1.8*  --   PROT 5.2*  --   --  5.1*  --   ALBUMIN 2.3*   < > 3.4* 3.1* 3.0*   < > = values in this interval not displayed.   No results for input(s): LIPASE, AMYLASE in the last 168 hours. Recent Labs  Lab 10/21/18 1114  AMMONIA 93*   CBC: Recent Labs  Lab 10/16/18 0516 10/17/18 0827 10/18/18 0436 10/19/18 0919 10/21/18 0527  WBC 4.3 3.2* 3.1* 4.7 4.2  NEUTROABS 3.2  --   --   --   --   HGB 8.2* 7.9* 7.2* 7.9* 7.6*  HCT 26.2* 25.1* 23.4* 26.1*  24.1*  MCV 100.8* 100.8* 101.7* 101.2* 99.2  PLT 39* 30* 29* 54* 38*   Cardiac Enzymes: No results for input(s): CKTOTAL, CKMB, CKMBINDEX, TROPONINI in the last 168 hours. CBG: Recent Labs  Lab 10/20/18 1117 10/20/18 1619 10/20/18 2117 10/21/18 0732 10/21/18 1151  GLUCAP 158* 146* 160* 134* 170*    Iron Studies: No results for input(s): IRON, TIBC, TRANSFERRIN, FERRITIN in the last 72 hours. Studies/Results: No results found. . insulin glargine  10 Units Subcutaneous QHS  . lactulose  10 g Oral BID  . midodrine  10 mg Oral TID WC  . pantoprazole  40 mg Oral Daily  . sodium bicarbonate  1,300 mg Oral TID  . sodium chloride flush  3 mL Intravenous Q12H    BMET    Component Value Date/Time   NA 144 10/21/2018 0527   K  4.5 10/21/2018 0527   CL 120 (H) 10/21/2018 0527   CO2 18 (L) 10/21/2018 0527   GLUCOSE 164 (H) 10/21/2018 0527   BUN 62 (H) 10/21/2018 0527   CREATININE 5.68 (H) 10/21/2018 0527   CREATININE 1.05 04/04/2018 0957   CALCIUM 8.9 10/21/2018 0527   GFRNONAA 9 (L) 10/21/2018 0527   GFRAA 10 (L) 10/21/2018 0527   CBC    Component Value Date/Time   WBC 4.2 10/21/2018 0527   RBC 2.43 (L) 10/21/2018 0527   HGB 7.6 (L) 10/21/2018 0527   HCT 24.1 (L) 10/21/2018 0527   PLT 38 (L) 10/21/2018 0527   MCV 99.2 10/21/2018 0527   MCH 31.3 10/21/2018 0527   MCHC 31.5 10/21/2018 0527   RDW 15.0 10/21/2018 0527   LYMPHSABS 0.5 (L) 10/16/2018 0516   MONOABS 0.4 10/16/2018 0516   EOSABS 0.1 10/16/2018 0516   BASOSABS 0.0 10/16/2018 0516      Assessment/Plan:  1. AKI/CKD stage 3- in setting of large volume paracentesis, concomitant ACE-inhibition, and worsening cirrhosis. This is most consistent with hepatorenal syndrome.  Family meeting yesterday resulted in his decision to go home with hospice.  He underwent tunneled peritoneal PleurX tube for prn paracentesis (not to exceed 3 liters per week).  Will continue with po midodrine at home but will stop octreotide  and remove IV from his left hand per his request.  Hospice is to go to his house tomorrow to help arrange for his discharge needs. He is not a candidate for liver transplant and has a poor functional status at baseline. I was quite honest that I do not believe that he would do well with HD and he agreed. His Cr appears to have reached a plateau to slightly improved and will continue to follow labs tomorrow per his wife's request.  Will sign off as nothing further to add but will be available for questions/concerns and will see him tomorrow.   2. NASH- with recurrent large ascites and not candidate for transplant. Would avoid any further large volume paracentesis attempts for now and agree with Hospice services.  Limit to no more than 3 liters per week unless he becomes uncomfortable but large volume paracentesis will precipitate worsening renal function.  He admits that he is ready to die and is not afraid. 3. Anemia- started on Aranesp 4. hyperkalemia- improved 5. Metabolic acidosis on bicarb 6. Thrombocytopenia- due to Cirrhosis 7. Deconditioning- pt with poor functional status at baseline and has now worsened due acute illness and hospitalization. He is now transitioning to home hospice.  Dc IV after octreotide infusion is completed.  Poor overall prognosis.  Irena CordsJoseph A. Aleister Lady, MD [facility] [phone]

## 2018-10-22 NOTE — Procedures (Signed)
Interventional Radiology Procedure Note  Procedure: Tunneled peritoneal PleurX drainage catheter placement  Complications: None  Estimated Blood Loss: < 10 mL  Findings: 15.5 Fr tunneled PleurX placed via right abdominal wall. Large volume paracentesis currently being performed.   Venetia Night. Kathlene Cote, M.D Pager:  [phone]

## 2018-10-23 DIAGNOSIS — Z794 Long term (current) use of insulin: Secondary | ICD-10-CM

## 2018-10-23 DIAGNOSIS — E119 Type 2 diabetes mellitus without complications: Secondary | ICD-10-CM

## 2018-10-23 DIAGNOSIS — N170 Acute kidney failure with tubular necrosis: Secondary | ICD-10-CM

## 2018-10-23 LAB — RENAL FUNCTION PANEL
ANION GAP: 8 (ref 5–15)
Albumin: 2.7 g/dL — ABNORMAL LOW (ref 3.5–5.0)
BUN: 58 mg/dL — AB (ref 8–23)
CO2: 17 mmol/L — AB (ref 22–32)
Calcium: 8.7 mg/dL — ABNORMAL LOW (ref 8.9–10.3)
Chloride: 117 mmol/L — ABNORMAL HIGH (ref 98–111)
Creatinine, Ser: 5.24 mg/dL — ABNORMAL HIGH (ref 0.61–1.24)
GFR calc Af Amer: 11 mL/min — ABNORMAL LOW (ref >60)
GFR calc non Af Amer: 10 mL/min — ABNORMAL LOW (ref >60)
GLUCOSE: 179 mg/dL — AB (ref 70–99)
POTASSIUM: 4.6 mmol/L (ref 3.5–5.1)
Phosphorus: 4.6 mg/dL (ref 2.5–4.6)
Sodium: 142 mmol/L (ref 135–145)

## 2018-10-23 LAB — AMMONIA: Ammonia: 71 umol/L — ABNORMAL HIGH (ref 9–35)

## 2018-10-23 LAB — GLUCOSE, CAPILLARY
GLUCOSE-CAPILLARY: 213 mg/dL — AB (ref 70–99)
Glucose-Capillary: 128 mg/dL — ABNORMAL HIGH (ref 70–99)

## 2018-10-23 MED ORDER — MIDODRINE HCL 10 MG PO TABS: 10.0000 mg | ORAL_TABLET | Freq: Three times a day (TID) | ORAL | 0 refills | Status: AC

## 2018-10-23 MED ORDER — ALPRAZOLAM 0.25 MG PO TABS: 0.2500 mg | ORAL_TABLET | Freq: Three times a day (TID) | ORAL | 0 refills | Status: AC | PRN

## 2018-10-23 MED ORDER — HYDROMORPHONE HCL 1 MG/ML PO LIQD: 1.0000 mg | ORAL | 0 refills | Status: AC | PRN

## 2018-10-23 MED ORDER — INSULIN GLARGINE 100 UNIT/ML ~~LOC~~ SOLN: 10.0000 [IU] | Freq: Every day | SUBCUTANEOUS | Status: AC

## 2018-10-23 MED ORDER — SODIUM BICARBONATE 650 MG PO TABS: 1300.0000 mg | ORAL_TABLET | Freq: Three times a day (TID) | ORAL | 0 refills | Status: AC

## 2018-10-23 NOTE — Progress Notes (Signed)
Talked with [name] at bedside.  Sherilyn CooterHenry looks very comfortable.  He opens his eyes and smiles.  Provided therapeutic listening to [place].  She asked if he would live thru the holidays.    Discussed discharge with RN staff.  They were wonderful and immediately jumped in to discharge Mr. Larry Hayes promptly so that he can be comfortable in his own home ASAP.  Norvel RichardsMarianne Sandhya Denherder, PA-C Palliative Medicine Pager: [phone]  Time 15 min.

## 2018-10-23 NOTE — Discharge Instructions (Signed)
Larry Hayes,  You were admitted with kidney failure. You have been transitioned to hospice. Your medications have been adjusted. You have also had a PleurX catheter placed in your abdomen. It was a pleasure meeting you and taking care of you. Please enjoy your two [employer] ice cream cones!

## 2018-10-23 NOTE — Evaluation (Signed)
Physical Therapy Evaluation and Discharge Patient Details Name: Larry Hayes MRN: [id number] DOB: [birth year] Today's Date: 10/23/2018   History of Present Illness  Pt is a 72 y/o male with a PMH significant for cirrhosis 2 NASH, recurrent ascites, IDDM, HTN, chronic anemia. Pt presented with worsening renal function and was found to be in renal failure. As pt is not a candidate for HD and plan is for d/c home with Hospice services.   Clinical Impression  Patient evaluated by Physical Therapy with no further acute PT needs identified. All education has been completed and the patient has no further questions. At the time of PT eval pt was able to perform transfers and ambulation with gross min guard assist to supervision for safety with RW. Recommend further RW use for balance and energy conservation. Pt reports wanting to get up and do as much as he can around the house - discussed safe activity progression at home with pt and wife. Feel he can safely be up as much as he wants to as long as he is not wearing himself out to complete a task. Pt also asking about exercises to keep his legs strong as long as possible. Pt and wife were instructed in LAQ's, SLR, and hip abd/add. Plan is for Hospice at Home services to follow up with pt at d/c. See below for any follow-up Physical Therapy or equipment needs. PT is signing off. Thank you for this referral.     Follow Up Recommendations No PT follow up;Supervision for mobility/OOB    Equipment Recommendations  None recommended by PT    Recommendations for Other Services       Precautions / Restrictions Precautions Precautions: Fall Restrictions Weight Bearing Restrictions: No      Mobility  Bed Mobility               General bed mobility comments: Pt sitting up on EOB when PT arrived.   Transfers Overall transfer level: Needs assistance Equipment used: Rolling walker (2 wheeled) Transfers: Sit to/from Stand Sit to Stand: Min guard          General transfer comment: Close guard for safety as pt powered up to full stand. No assistance required but pt was cued for hand placement on seated surface for safety.   Ambulation/Gait Ambulation/Gait assistance: Min guard;Supervision Gait Distance (Feet): 175 Feet Assistive device: Rolling walker (2 wheeled) Gait Pattern/deviations: Step-through pattern;Decreased stride length;Trunk flexed Gait velocity: Decreased Gait velocity interpretation: <1.31 ft/sec, indicative of household ambulator General Gait Details: Slow but generally steady with RW.    Stairs            Wheelchair Mobility    Modified Rankin (Stroke Patients Only)       Balance Overall balance assessment: Needs assistance Sitting-balance support: Feet supported;No upper extremity supported Sitting balance-Leahy Scale: Fair     Standing balance support: No upper extremity supported;During functional activity Standing balance-Leahy Scale: Poor Standing balance comment: Reliant on RW support                             Pertinent Vitals/Pain Pain Assessment: No/denies pain    Home Living Family/patient expects to be discharged to:: Private residence Living Arrangements: Spouse/significant other Available Help at Discharge: Family;Available 24 hours/day Type of Home: House Home Access: Stairs to enter   [employer] of Steps: 2   Home Equipment: Shower seat;Bedside commode;Cane - single point;Walker - 2 wheels  Prior Function Level of Independence: Independent with assistive device(s)         Comments: Using the cane all the time.      Hand Dominance        Extremity/Trunk Assessment   Upper Extremity Assessment Upper Extremity Assessment: Overall WFL for tasks assessed    Lower Extremity Assessment Lower Extremity Assessment: Generalized weakness    Cervical / Trunk Assessment Cervical / Trunk Assessment: Other exceptions Cervical / Trunk  Exceptions: Forward head posture with rounded shoulders  Communication   Communication: No difficulties  Cognition Arousal/Alertness: Awake/alert Behavior During Therapy: WFL for tasks assessed/performed Overall Cognitive Status: Within Functional Limits for tasks assessed                                        General Comments      Exercises     Assessment/Plan    PT Assessment Patent does not need any further PT services  PT Problem List Decreased strength;Decreased range of motion;Decreased activity tolerance;Decreased balance;Decreased mobility;Decreased knowledge of use of DME;Decreased safety awareness;Decreased knowledge of precautions       PT Treatment Interventions      PT Goals (Current goals can be found in the Care Plan section)  Acute Rehab PT Goals Patient Stated Goal: Home today with family PT Goal Formulation: All assessment and education complete, DC therapy    Frequency     Barriers to discharge        Co-evaluation               AM-PAC PT "6 Clicks" Daily Activity  Outcome Measure Difficulty turning over in bed (including adjusting bedclothes, sheets and blankets)?: None Difficulty moving from lying on back to sitting on the side of the bed? : A Little Difficulty sitting down on and standing up from a chair with arms (e.g., wheelchair, bedside commode, etc,.)?: A Little Help needed moving to and from a bed to chair (including a wheelchair)?: A Little Help needed walking in hospital room?: A Little Help needed climbing 3-5 steps with a railing? : A Little 6 Click Score: 19    End of Session Equipment Utilized During Treatment: Gait belt Activity Tolerance: Patient tolerated treatment well Patient left: in chair;with call bell/phone within reach;with family/visitor present Nurse Communication: Mobility status PT Visit Diagnosis: Unsteadiness on feet (R26.81);Difficulty in walking, not elsewhere classified (R26.2)    Time:  [id number] PT Time Calculation (min) (ACUTE ONLY): 35 min   Charges:   PT Evaluation $PT Eval Moderate Complexity: 1 Mod PT Treatments $Gait Training: 8-22 mins        Conni SlipperLaura Tashana Haberl, PT, DPT Acute Rehabilitation Services Pager: [phone] Office: [phone]   Marylynn PearsonLaura D Khole Arterburn 10/23/2018, 11:07 AM

## 2018-10-23 NOTE — Progress Notes (Signed)
Spoke to patient and spouse at bedside. Wife states that equipment was delivered to house yesterday. She has number of hospice nurse to call when they arrive home. MD states patient has been on RA and does not need O2 for transport. Patient has been walking in room, and wife states that she can take him home via private care, CM agrees. Visualized PluerX box of extra drain to take home in room, wife verbalized understanding that she is to take this home. Letha Capeeborah Taylor RN CM states she will obtain completed PleurX order and FAX it today.  Notified hospice that DC is anticipated sometime this afternoon.

## 2018-10-23 NOTE — Discharge Summary (Signed)
Physician Discharge Summary  Larry Hayes YDX:[id number] DOB: 12-27-1945 DOA: 10/15/2018  PCP: Olena Mater, MD  Admit date: 10/15/2018 Discharge date: 10/23/2018  Admitted From: Home Disposition: Home with hospice  Recommendations for Outpatient Follow-up:  1. Drain a maximum of 3L via PleurX catheter every week  Home Health: Hospice Equipment/Devices: Hospice  Discharge Condition: Guarded CODE STATUS: DNR Diet recommendation: Regular diet   Brief/Interim Summary:  Admission HPI written by Vianne Bulls, MD   Chief Complaint: Nausea, malaise, sent by nephrologist for ARF   HPI: Larry Hayes is a 72 y.o. 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  Patient reports a couple month history of progressive exertional dyspnea, fatigue, malaise, loss of appetite, nausea with frequent dry heaves, and chronic diarrhea.  He denies any significant abdominal pain and reports that his last paracentesis was 2 weeks ago with ~7 L off at that time.  Denies any fevers or chills.  Denies any melena or hematochezia recently.  Denies hematemesis.  Creatinine was 1.05 in April, reportedly a little over 2 one month ago, and now 5.9 with his nephrologist today, prompting him to be directed to the ED for further evaluation and management of this.  [facility] ED Course: Upon arrival to the ED, patient is found to be afebrile, saturating well on room air, and with vitals otherwise normal.  EKG features a sinus rhythm with low voltage QRS.  Chest x-ray features low volume lungs without any acute finding.  CT of the abdomen and pelvis is negative for hydronephrosis or obstructive uropathy, notable for punctate stones in the lower pole the left  kidney, cirrhosis with splenomegaly and upper abdominal varices, and massive ascites.  Chemistry panel features a potassium of 5.4, bicarbonate of 15, BUN 74, and creatinine 5.83.  Albumin is 2.6, AST 67, total protein 5.4, and total bilirubin 1.3.  Ammonia was normal.  CBC features a microcytic anemia with hemoglobin 9.1 and MCV 102.1.  Platelets are down at 54,000.  Patient was given 2 L normal saline in the ED.  Transfer to [facility] was arranged for further evaluation and management of acute renal with concern for possible hepatorenal syndrome.   Hospital course:  Acute renal failure Thought to be secondary to hepatorenal syndrome versus progressive renal disease.  Patient with decreased urine output.  Creatinine has stabilized.  Currently not a hemodialysis candidate.  Plans for home with hospice.  Hepatic ascites Cirrhosis secondary to [facility] Patient is status post peritoneal Pleurx for palliative management.  Patient also with elevated ammonia for which she is on lactulose.  Drain will be managed by hospice at home. Drain a maximum of 3L weekly.  Thrombocytopenia Secondary to liver disease.  Patient is now comfort care on hospice.  Diabetes mellitus, type 2 Patient is on metformin and Lantus as an outpatient. Discontinue metformin. Decrease to Lantus 10 units.  Anemia of chronic disease Hemoglobin currently stable.  In setting of cirrhosis.  Metabolic acidosis Secondary to kidney failure. Continue sodium bicarbonate tablets  Discharge Diagnoses:  Principal Problem:   Acute renal failure (ARF) (HCC) Active Problems:   Insulin-requiring or dependent type II diabetes mellitus (HCC)   Macrocytic anemia   Hepatic cirrhosis (HCC)   Thrombocytopenia (HCC)   Metabolic acidosis   Acute renal failure ([place])   Palliative  care encounter   Liver cirrhosis secondary to NASH [facility])   Encounter for hospice care discussion    Discharge Instructions   Allergies as of  10/23/2018   No Known Allergies     Medication List    STOP taking these medications   aspirin EC 81 MG tablet   atorvastatin 80 MG tablet Commonly known as:  LIPITOR   furosemide 40 MG tablet Commonly known as:  LASIX   isosorbide dinitrate 30 MG tablet Commonly known as:  ISORDIL   lactulose 10 GM/15ML solution Commonly known as:  CHRONULAC   lisinopril 5 MG tablet Commonly known as:  PRINIVIL,ZESTRIL   metFORMIN 1000 MG tablet Commonly known as:  GLUCOPHAGE   metoprolol succinate 25 MG 24 hr tablet Commonly known as:  TOPROL-XL   MULTIVITAMIN PO   sertraline 50 MG tablet Commonly known as:  ZOLOFT   spironolactone 50 MG tablet Commonly known as:  ALDACTONE   STRESS B PO     TAKE these medications   ALPRAZolam 0.25 MG tablet Commonly known as:  XANAX Take 1 tablet (0.25 mg total) by mouth 3 (three) times daily as needed for anxiety or sleep.   HYDROmorphone HCl 1 MG/ML Liqd Commonly known as:  DILAUDID Take 1 mL (1 mg total) by mouth every 2 (two) hours as needed for severe pain (shortness of breath, distress).   insulin glargine 100 UNIT/ML injection Commonly known as:  LANTUS Inject 0.1 mLs (10 Units total) into the skin at bedtime. What changed:  how much to take   midodrine 10 MG tablet Commonly known as:  PROAMATINE Take 1 tablet (10 mg total) by mouth 3 (three) times daily with meals.   omeprazole 20 MG capsule Commonly known as:  PRILOSEC Take 1 capsule (20 mg total) by mouth daily.   sodium bicarbonate 650 MG tablet Take 2 tablets (1,300 mg total) by mouth 3 (three) times daily.   VELTASSA 16.8 g Pack Generic drug:  Patiromer Sorbitex Calcium Take 16.8 g by mouth daily. Dissolve 1 packet in 1/3 cup of water and drink daily.      [facility] Follow up.   Why:  Home Hospice Contact information: [address] [place] [address] [phone]          No Known  Allergies  Consultations:  Interventional radiology   Procedures/Studies: US Paracentesis  Result Date: 10/19/2018 INDICATION: History of NASH, liver cirrhosis and recurrent ascites requiring frequent paracenteses. Request for therapeutic paracentesis with 3 liter maximum. EXAM: ULTRASOUND GUIDED THERAPEUTIC PARACENTESIS MEDICATIONS: 10 mL 1% lidocaine COMPLICATIONS: None immediate. PROCEDURE: Informed written consent was obtained from the patient after a discussion of the risks, benefits and alternatives to treatment. A timeout was performed prior to the initiation of the procedure. Initial ultrasound scanning demonstrates a large amount of ascites within the right lower abdominal quadrant. The right lower abdomen was prepped and draped in the usual sterile fashion. 1% lidocaine was used for local anesthesia. Following this, a 19 gauge, 10-cm, Yueh catheter was introduced. An ultrasound image was saved for documentation purposes. The paracentesis was performed. The catheter was removed and a dressing was applied. The patient tolerated the procedure well without immediate post procedural complication. FINDINGS: A total of approximately 3.0 L of clear yellow fluid was removed which was requested maximum. Residual fluid remains in abdomen. IMPRESSION: Successful ultrasound-guided paracentesis yielding 3.0 liters of peritoneal fluid. Read by Candiss Norse, PA-C Electronically Signed  By: Jacqulynn Cadet M.D.   On: 10/19/2018 11:11   US Paracentesis  Result Date: 10/03/2018 INDICATION: Cirrhosis, ascites EXAM: ULTRASOUND GUIDED DIAGNOSTIC AND THERAPEUTIC PARACENTESIS MEDICATIONS: None. COMPLICATIONS: None immediate. PROCEDURE: Procedure, benefits, and risks of procedure were discussed with patient. Written informed consent for procedure was obtained. Time out protocol followed. Adequate collection of ascites localized by ultrasound in RIGHT lower quadrant. Skin prepped and draped in usual sterile  fashion. Skin and soft tissues anesthetized with 10 mL of 1% lidocaine. 5 [place] Yueh catheter placed into peritoneal cavity. 8.5 L of yellow ascitic fluid aspirated by vacuum bottle suction. Procedure tolerated well by patient without immediate complication. FINDINGS: A total of approximately 8.5 L of ascitic fluid was removed. Samples were sent to the laboratory as requested by the clinical team. IMPRESSION: Successful ultrasound-guided paracentesis yielding 8.5 liters of peritoneal fluid. Electronically Signed   By: Lavonia Dana M.D.   On: 10/03/2018 12:44   Ir Perc Athena Masse Perit Cath Wo Port  Result Date: 10/22/2018 CLINICAL DATA:  Cirrhosis and refractory ascites. Request has been made to place a tunneled peritoneal drainage catheter for palliative purposes. EXAM: INSERTION OF TUNNELED PERITONEAL DRAINAGE CATHETER ANESTHESIA/SEDATION: 1.0 mg IV Versed; 50 mcg IV Fentanyl. Total Moderate Sedation Time 19 minutes. The patient's level of consciousness and physiologic status were continuously monitored during the procedure by Radiology nursing. MEDICATIONS: 2 g IV Ancef. Antibiotic was administered in an appropriate time interval for the procedure. FLUOROSCOPY TIME:  Less than 6 seconds.  1.3 mGy. PROCEDURE: The procedure, risks, benefits, and alternatives were explained to the patient. Questions regarding the procedure were encouraged and answered. The patient understands and consents to the procedure. A time-out was performed prior to initiating the procedure. The right abdominal wall was prepped with chlorhexidine in a sterile fashion, and a sterile drape was applied covering the operative field. A sterile gown and sterile gloves were used for the procedure. Local anesthesia was provided with 1% Lidocaine. Ultrasound image documentation was performed. Fluoroscopy during the procedure and fluoroscopic spot radiograph confirms appropriate catheter position. After creating a small skin incision, a 19 gauge needle  was advanced into the peritoneal cavity under ultrasound guidance. A guide wire was then advanced under fluoroscopy into the peritoneal cavity. Peritoneal access was dilated serially and a 16-French peel-away sheath placed. A 15.5 French tunneled peritoneal PleurX catheter was placed. This was tunneled from an incision 5 cm below the peritoneal access to the access site. The catheter was advanced through the peel-away sheath. The sheath was then removed. Final catheter positioning was confirmed with a fluoroscopic spot image. The peritoneal access incision was closed with subcuticular 4-0 Vicryl. Dermabond was applied to the incision. A Prolene retention suture was applied at the catheter exit site. Large volume paracentesis was performed through the new catheter utilizing drainage bottles. COMPLICATIONS: None. FINDINGS: The catheter was placed via the right abdominal wall. Catheter course is in the right lower quadrant. Approximately 7.3 liters of ascites was able to be removed after catheter placement. IMPRESSION: Placement of tunneled peritoneal drainage catheter via right abdominal approach. 7.3 liters of ascites was removed today after catheter placement. Electronically Signed   By: Aletta Edouard M.D.   On: 10/22/2018 14:15      Subjective: No issues today. Wife feels he is doing pretty well today considering course.  Discharge Exam: Vitals:   10/23/18 0530 10/23/18 0742  BP: (!) 114/38 118/64  Pulse: (!) 57 60  Resp: 18 18  Temp: (!) 97.5 F (  36.4 C)   SpO2: 100% 99%   Vitals:   10/22/18 1733 10/22/18 2023 10/23/18 0530 10/23/18 0742  BP: (!) 115/56 (!) 132/55 (!) 114/38 118/64  Pulse: 60 (!) 56 (!) 57 60  Resp: 18 20 18 18   Temp: 97.8 F (36.6 C) (!) 97.5 F (36.4 C) (!) 97.5 F (36.4 C)   TempSrc: Oral Oral Oral   SpO2: 98% 100% 100% 99%  Weight:        General: Pt is alert, awake, not in acute distress Cardiovascular: RRR, S1/S2 +, no rubs, no gallops Respiratory: CTA  bilaterally, no wheezing, no rhonchi Abdominal: Soft, NT, distended, bowel sounds + Extremities: LE pitting edema, no cyanosis    The results of significant diagnostics from this hospitalization (including imaging, microbiology, ancillary and laboratory) are listed below for reference.     Microbiology: No results found for this or any previous visit (from the past 240 hour(s)).   Labs: BNP (last 3 results) No results for input(s): BNP in the last 8760 hours. Basic Metabolic Panel: Recent Labs  Lab 10/17/18 0827 10/18/18 0436 10/19/18 0919 10/20/18 0639 10/21/18 0527 10/23/18 0426  NA 139 138 138 142 144 142  K 5.2* 5.1 5.4* 4.6 4.5 4.6  CL 117* 116* 116* 118* 120* 117*  CO2 16* 15* 16* 18* 18* 17*  GLUCOSE 76 198* 184* 146* 164* 179*  BUN 69* 65* 65* 64* 62* 58*  CREATININE 5.58* 5.65* 5.75* 5.78* 5.68* 5.24*  CALCIUM 8.8* 8.5* 8.8* 9.0 8.9 8.7*  PHOS 5.0* 4.9*  --   --  4.1 4.6   Liver Function Tests: Recent Labs  Lab 10/17/18 0827 10/18/18 0436 10/19/18 0919 10/21/18 0527 10/23/18 0426  AST  --   --  53*  --   --   ALT  --   --  20  --   --   ALKPHOS  --   --  80  --   --   BILITOT  --   --  1.8*  --   --   PROT  --   --  5.1*  --   --   ALBUMIN 3.0* 3.4* 3.1* 3.0* 2.7*   No results for input(s): LIPASE, AMYLASE in the last 168 hours. Recent Labs  Lab 10/21/18 1114 10/23/18 0426  AMMONIA 93* 71*   CBC: Recent Labs  Lab 10/17/18 0827 10/18/18 0436 10/19/18 0919 10/21/18 0527  WBC 3.2* 3.1* 4.7 4.2  HGB 7.9* 7.2* 7.9* 7.6*  HCT 25.1* 23.4* 26.1* 24.1*  MCV 100.8* 101.7* 101.2* 99.2  PLT 30* 29* 54* 38*   Cardiac Enzymes: No results for input(s): CKTOTAL, CKMB, CKMBINDEX, TROPONINI in the last 168 hours. BNP: Invalid input(s): POCBNP CBG: Recent Labs  Lab 10/20/18 2117 10/21/18 0732 10/21/18 1151 10/23/18 0741 10/23/18 1119  GLUCAP 160* 134* 170* 128* 213*   Urinalysis    Component Value Date/Time   COLORURINE YELLOW 10/16/2018  0206   APPEARANCEUR CLEAR 10/16/2018 0206   LABSPEC 1.013 10/16/2018 0206   PHURINE 5.0 10/16/2018 0206   GLUCOSEU NEGATIVE 10/16/2018 0206   HGBUR NEGATIVE 10/16/2018 0206   BILIRUBINUR NEGATIVE 10/16/2018 0206   KETONESUR NEGATIVE 10/16/2018 0206   PROTEINUR NEGATIVE 10/16/2018 0206   NITRITE NEGATIVE 10/16/2018 0206   LEUKOCYTESUR TRACE (A) 10/16/2018 0206    SIGNED:   Cordelia Poche, MD [facility] 10/23/2018, 12:41 PM

## 2018-10-26 ENCOUNTER — Emergency Department (HOSPITAL_COMMUNITY)
Admission: EM | Admit: 2018-10-26 | Discharge: 2018-10-27 | Disposition: A | Discharge status: Home / Self Care | Attending: Emergency Medicine | Admitting: Emergency Medicine

## 2018-10-26 ENCOUNTER — Encounter (HOSPITAL_COMMUNITY): Payer: Self-pay | Admitting: Emergency Medicine

## 2018-10-26 DIAGNOSIS — K7460 Unspecified cirrhosis of liver: Secondary | ICD-10-CM | POA: Diagnosis present

## 2018-10-26 DIAGNOSIS — I10 Essential (primary) hypertension: Secondary | ICD-10-CM | POA: Diagnosis not present

## 2018-10-26 DIAGNOSIS — R188 Other ascites: Secondary | ICD-10-CM | POA: Diagnosis not present

## 2018-10-26 DIAGNOSIS — E7800 Pure hypercholesterolemia, unspecified: Secondary | ICD-10-CM | POA: Insufficient documentation

## 2018-10-26 DIAGNOSIS — R180 Malignant ascites: Secondary | ICD-10-CM

## 2018-10-26 DIAGNOSIS — Z87891 Personal history of nicotine dependence: Secondary | ICD-10-CM | POA: Insufficient documentation

## 2018-10-26 DIAGNOSIS — E119 Type 2 diabetes mellitus without complications: Secondary | ICD-10-CM | POA: Diagnosis not present

## 2018-10-26 NOTE — ED Triage Notes (Signed)
Reports having a drainage tube to the RLQ that has not been draining and is leaking from around the site.  Started leaking worse yesterday.

## 2018-10-26 NOTE — ED Provider Notes (Addendum)
[facility] EMERGENCY DEPARTMENT Provider Note   CSN: [id number] Arrival date & time: 10/26/18  2159     History   Chief Complaint Chief Complaint  Patient presents with  . Drainage tube blocked    HPI Larry Hayes is a 72 y.o. male with history of cirrhosis secondary to NASH, hepatic encephalopathy, acute renal failure, thrombocytopenia, diabetes on insulin, anemia, recent RLQ peritoneal 15.5 Fr Pleurx catheter placed by IR (Dr. Deanne Coffer, 11/13) for ascites presents to the ER for evaluation of blockage in the drain with clear fluid draining out of the incision site for the last 2 days.  Patient is with home hospice through [facility].  Hospice RN tried to remove fluid from the drain yesterday and today and she was unable to.  RN contacted hospice MD who recommended patient be taken to [facility] ER for further evaluation.  Patient is drowsy but family states this is his baseline.  They deny recent fevers, cough, complaints of chest pain, abdominal pain or changes in his baseline behavior.  Patient denies any abdominal pain and has no other complaints today.  HPI  Past Medical History:  Diagnosis Date  . Anemia   . Anxiety   . Arthritis   . Diabetes (HCC)   . GERD (gastroesophageal reflux disease)   . 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 High cholesterol   . HOH (hard of hearing)   . Hypertension   . Sleep apnea   . Thrombocytopenia (HCC) 08/21/2016    Patient Active Problem List   Diagnosis Date Noted  . Liver cirrhosis secondary to NASH (HCC)   . Encounter for hospice care discussion   . Palliative care encounter   . Acute renal failure (ARF) (HCC) 10/16/2018  . Hyperkalemia 10/16/2018  . Metabolic acidosis 10/16/2018  . Acute renal failure (HCC) 10/16/2018  . Other cirrhosis of liver (HCC) 04/01/2018  . Other ascites 04/01/2018  . 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 Thrombocytopenia (HCC) 08/21/2016  . Iron deficiency anemia due to  chronic blood loss 07/31/2016  . Guaiac positive stools 07/31/2016  . Insulin-requiring or dependent type II diabetes mellitus (HCC) 05/23/2016  . High cholesterol 05/23/2016  . Macrocytic anemia 05/23/2016    Past Surgical History:  Procedure Laterality Date  . CATARACT EXTRACTION Right 11/2016   also had left eye done  . ESOPHAGEAL BANDING N/A 04/07/2018   Procedure: ESOPHAGEAL BANDING;  Surgeon: Malissa Hippo, MD;  Location: [facility];  Service: Endoscopy;  Laterality: N/A;  . ESOPHAGOGASTRODUODENOSCOPY N/A 08/01/2016   Procedure: ESOPHAGOGASTRODUODENOSCOPY (EGD);  Surgeon: Malissa Hippo, MD;  Location: [facility] SUITE;  Service: Endoscopy;  Laterality: N/A;  2:45  . ESOPHAGOGASTRODUODENOSCOPY (EGD) WITH PROPOFOL N/A 04/07/2018   Procedure: ESOPHAGOGASTRODUODENOSCOPY (EGD) WITH PROPOFOL;  Surgeon: Malissa Hippo, MD;  Location: [facility];  Service: Endoscopy;  Laterality: N/A;  . HERNIA REPAIR     bilateral inguinal age 22  . IR PERC TUN PERIT CATH WO PORT S&I Judi Cong  10/22/2018  . IR REMOVAL PERM PERITONEAL CATH  10/27/2018  . KNEE SURGERY     rt arthroscopy        Home Medications    Prior to Admission medications   Medication Sig Start Date End Date Taking? Authorizing Provider  ALPRAZolam (XANAX) 0.25 MG tablet Take 1 tablet (0.25 mg total) by mouth 3 (three) times daily as needed for anxiety or sleep. 10/23/18  Yes Narda Bonds, MD  HYDROmorphone HCl (DILAUDID) 1 MG/ML LIQD  Take 1 mL (1 mg total) by mouth every 2 (two) hours as needed for severe pain (shortness of breath, distress). 10/23/18  Yes Narda Bonds, MD  insulin glargine (LANTUS) 100 UNIT/ML injection Inject 0.1 mLs (10 Units total) into the skin at bedtime. 10/23/18  Yes Narda Bonds, MD  midodrine (PROAMATINE) 10 MG tablet Take 1 tablet (10 mg total) by mouth 3 (three) times daily with meals. 10/23/18  Yes Narda Bonds, MD  omeprazole (PRILOSEC) 20 MG capsule Take 1 capsule (20 mg total) by  mouth daily. Patient taking differently: Take 20 mg by mouth daily after supper.  04/01/18  Yes Rehman, Joline Maxcy, MD  Patiromer Sorbitex Calcium (VELTASSA) 16.8 g PACK Take 16.8 mg by mouth See admin instructions. Dissolve 1 packet in 1/3 cup of water and drink daily at noon   Yes [provider]  sodium bicarbonate 650 MG tablet Take 2 tablets (1,300 mg total) by mouth 3 (three) times daily. 10/23/18  Yes Narda Bonds, MD    Family History Family History  Problem Relation Age of Onset  . Obesity Son     Social History Social History   Tobacco Use  . Smoking status: Former Smoker    Packs/day: 1.50    Types: Cigarettes    Last attempt to quit: 05/23/2001    Years since quitting: 17.4  . Smokeless tobacco: Never Used  Substance Use Topics  . Alcohol use: No    Alcohol/week: 0.0 standard drinks  . Drug use: No     Allergies   Patient has no known allergies.  Review of Systems Review of Systems  All other systems reviewed and are negative.  Physical Exam Updated Vital Signs BP (!) 118/40 (BP Location: Left Arm)   Pulse (!) 56   Temp 98.2 F (36.8 C) (Axillary)   Resp 16   Ht 5\' 9"  (1.753 m)   Wt 105.2 kg   SpO2 98%   BMI 34.26 kg/m   Physical Exam  Constitutional: He is oriented to person, place, and time. He appears well-developed and well-nourished.  Non toxic.  Drowsy but can maintain a conversation.  Family at bedside.  HENT:  Head: Normocephalic and atraumatic.  Nose: Nose normal.  Eyes: Pupils are equal, round, and reactive to light. Conjunctivae and EOM are normal. Scleral icterus is present.  Neck: Normal range of motion.  Cardiovascular: Normal rate, regular rhythm and normal heart sounds.  Pulmonary/Chest: Effort normal. He has wheezes.  Diffuse expiratory wheezing in all lung fields, louder in RLL. No improvement with forceful cough.  Abdominal: Soft. Bowel sounds are normal. He exhibits distension. There is no tenderness.  Moderate  ascites noted.  No tenderness.  Soft. Catheter to the RLQ without surrounding erythema, warmth, tenderness.  Scant amount of clear liquid noted on the dressing.  Pt can sit up, turn side ways for exam without abd pain. No G/R/R. No peritonitis.   Musculoskeletal: Normal range of motion.  Neurological: He is alert and oriented to person, place, and time.  Skin: Skin is warm and dry. Capillary refill takes less than 2 seconds.  Psychiatric: He has a normal mood and affect. His behavior is normal. Judgment and thought content normal.  Nursing note and vitals reviewed.  ED Treatments / Results  Labs (all labs ordered are listed, but only abnormal results are displayed) Labs Reviewed  CBC WITH DIFFERENTIAL/PLATELET - Abnormal; Notable for the following components:      Result Value   RBC  2.74 (*)    Hemoglobin 8.5 (*)    HCT 28.6 (*)    MCV 104.4 (*)    MCHC 29.7 (*)    RDW 15.9 (*)    Platelets 42 (*)    All other components within normal limits  COMPREHENSIVE METABOLIC PANEL - Abnormal; Notable for the following components:   Sodium 129 (*)    Potassium 5.2 (*)    CO2 18 (*)    Glucose, Bld 388 (*)    BUN 51 (*)    Creatinine, Ser 4.86 (*)    Calcium 8.4 (*)    Total Protein 4.9 (*)    Albumin 2.8 (*)    AST 50 (*)    Total Bilirubin 1.9 (*)    GFR calc non Af Amer 11 (*)    GFR calc Af Amer 12 (*)    All other components within normal limits  PROTIME-INR - Abnormal; Notable for the following components:   Prothrombin Time 23.2 (*)    All other components within normal limits  CBG MONITORING, ED - Abnormal; Notable for the following components:   Glucose-Capillary 246 (*)    All other components within normal limits    EKG None  Radiology No results found.  Procedures Procedures (including critical care time)  Medications Ordered in ED Medications - No data to display   Initial Impression / Assessment and Plan / ED Course  I have reviewed the triage vital signs  and the nursing notes.  Pertinent labs & imaging results that were available during my care of the patient were reviewed by me and considered in my medical decision making (see chart for details).  Clinical Course as of Oct 29 2358  Mon Oct 27, 2018  0107 Hemoglobin(!): 8.5 [CG]  0107 Platelets(!): 42 [CG]  0107 Potassium(!): 5.2 [CG]  0107 Creatinine(!): 4.86 [CG]  0107 AST(!): 50 [CG]  0107 GFR, Est Non African American(!): 11 [CG]  1203 Spoke with Sharyl NimrodMeredith, hospice nurse, regarding the patient's plan.  Discussed that the catheter has been removed and Dermabond was placed over the site to prevent any leaking.  Bedside ultrasound performed by IR did not show any ascites at this time.  IR team discussed with the patient's wife to contact Dr. Karilyn Cotaehman for paracentesis order that [facility] be [facility] at [facility].    [MM]  1228 Patient evaluated in the ED. The patient has a dressing in the RLQ covered with Tegaderm that is saturated with clear fluid. Tegaderm removed and gauze dressing is saturated with fluid. There is a stream of fluid is briskly leaking from the site, soaking his pants and gown.    [MM]  1322 Spoke with the patient's nurse.  The patient was seen by VIR PA who packed the wound and recommended the patient be non-dependent on his right side for the next 30 minutes to an hour.  Discussed with the patient's nurse that I will reevaluate the patient in 45 minutes and if leaking has significantly improved, will plan for discharge.   [MM]  1405 I evaluated the patient after he had been nondependent on his right side for the last 45 minutes.  Dressing was dry with no signs of any drainage.  The patient and his wife felt comfortable with discharge at this time.  Discussed follow-up per IR's note.  All questions answered.  The patient is hemodynamically stable and in no acute distress.  He is safe for discharge home with outpatient follow-up at this time.   [  MM]    Clinical Course User  Index [CG] Liberty Handy, PA-C [MM] McDonald, Mia A, PA-C    No signs of peritonitis. No fevers. I doubt this will require emergent intervention by IR.  We will contact IR for recommendations.  Pt noted to have expiratory wheezing R>L without improvement after coughing.  Given recent hospital exposure, pmh, age will obtain CXR. Family states pt would accept abx for PNA. He denies fevers, chills, cough, CP.   0030: Spoke to Dr Lowella Dandy with IR who does not see indication for emergent intervention given lack of symptoms, exam.  IR can see pt tomorrow 0800. Pt could be discharged from IR standpoint.  Spoke to family and will offer bed for overnight obs in the ER. Pt will not have a ride back to hospital, they live 1 hour away. Elderly wife unable to transport pt back and forth. Consulting civil engineer notified.   0140: Labs including hemoglobin, platelets, creatinine at baseline.  Chest x-ray negative.  Family updated.  Awaiting IR in the morning. Will plan on handing pt off to morning team who will assist with IR recommendations.  Final Clinical Impressions(s) / ED Diagnoses   Final diagnoses:  Cirrhosis of liver with ascites, unspecified hepatic cirrhosis type [facility])    ED Discharge Orders    None       Liberty Handy, PA-C 10/27/18 0251    Liberty Handy, PA-C 10/29/18 2359    Gilda Crease, MD 11/09/18 [phone]

## 2018-10-26 NOTE — ED Notes (Signed)
In room to draw blood.  Family refusing until the provider hears back from IR.  Provider at the bedside.

## 2018-10-27 ENCOUNTER — Emergency Department (HOSPITAL_COMMUNITY)

## 2018-10-27 ENCOUNTER — Encounter (HOSPITAL_COMMUNITY): Payer: Self-pay | Admitting: Radiology

## 2018-10-27 HISTORY — PX: IR REMOVAL PERM PERITONEAL CATH: IMG2329

## 2018-10-27 LAB — CBC WITH DIFFERENTIAL/PLATELET
Abs Immature Granulocytes: 0.04 10*3/uL (ref 0.00–0.07)
Basophils Absolute: 0 10*3/uL (ref 0.0–0.1)
Basophils Relative: 1 %
EOS ABS: 0.2 10*3/uL (ref 0.0–0.5)
Eosinophils Relative: 3 %
HEMATOCRIT: 28.6 % — AB (ref 39.0–52.0)
Hemoglobin: 8.5 g/dL — ABNORMAL LOW (ref 13.0–17.0)
Immature Granulocytes: 1 %
LYMPHS ABS: 0.7 10*3/uL (ref 0.7–4.0)
Lymphocytes Relative: 14 %
MCH: 31 pg (ref 26.0–34.0)
MCHC: 29.7 g/dL — ABNORMAL LOW (ref 30.0–36.0)
MCV: 104.4 fL — ABNORMAL HIGH (ref 80.0–100.0)
Monocytes Absolute: 0.4 10*3/uL (ref 0.1–1.0)
Monocytes Relative: 7 %
NEUTROS PCT: 74 %
Neutro Abs: 4 10*3/uL (ref 1.7–7.7)
PLATELETS: 42 10*3/uL — AB (ref 150–400)
RBC: 2.74 MIL/uL — ABNORMAL LOW (ref 4.22–5.81)
RDW: 15.9 % — AB (ref 11.5–15.5)
WBC: 5.3 10*3/uL (ref 4.0–10.5)
nRBC: 0 % (ref 0.0–0.2)

## 2018-10-27 LAB — COMPREHENSIVE METABOLIC PANEL
ALT: 17 U/L (ref 0–44)
AST: 50 U/L — AB (ref 15–41)
Albumin: 2.8 g/dL — ABNORMAL LOW (ref 3.5–5.0)
Alkaline Phosphatase: 105 U/L (ref 38–126)
Anion gap: 5 (ref 5–15)
BILIRUBIN TOTAL: 1.9 mg/dL — AB (ref 0.3–1.2)
BUN: 51 mg/dL — AB (ref 8–23)
CO2: 18 mmol/L — ABNORMAL LOW (ref 22–32)
Calcium: 8.4 mg/dL — ABNORMAL LOW (ref 8.9–10.3)
Chloride: 106 mmol/L (ref 98–111)
Creatinine, Ser: 4.86 mg/dL — ABNORMAL HIGH (ref 0.61–1.24)
GFR calc Af Amer: 12 mL/min — ABNORMAL LOW (ref >60)
GFR calc non Af Amer: 11 mL/min — ABNORMAL LOW (ref >60)
GLUCOSE: 388 mg/dL — AB (ref 70–99)
POTASSIUM: 5.2 mmol/L — AB (ref 3.5–5.1)
Sodium: 129 mmol/L — ABNORMAL LOW (ref 135–145)
Total Protein: 4.9 g/dL — ABNORMAL LOW (ref 6.5–8.1)

## 2018-10-27 LAB — CBG MONITORING, ED: Glucose-Capillary: 246 mg/dL — ABNORMAL HIGH (ref 70–99)

## 2018-10-27 LAB — PROTIME-INR
INR: 2.09
Prothrombin Time: 23.2 seconds — ABNORMAL HIGH (ref 11.4–15.2)

## 2018-10-27 MED ORDER — LIDOCAINE HCL 1 % IJ SOLN
INTRAMUSCULAR | Status: AC
  Filled 2018-10-27: qty 20

## 2018-10-27 NOTE — ED Notes (Signed)
Delay in lab draw,  Pt not in room at this time. 

## 2018-10-27 NOTE — ED Provider Notes (Signed)
72 year old male received at sign out from [place] pending IR evaluation. Per HPI:   "Larry Hayes is a 72 y.o. male with history of cirrhosis secondary to NASH, hepatic encephalopathy, acute renal failure, thrombocytopenia, diabetes on insulin, anemia, recent RLQ peritoneal 15.5 Fr Pleurx catheter placed by IR (Dr. Vernard Gambles, 11/13) for ascites presents to the ER for evaluation of blockage in the drain with clear fluid draining out of the incision site for the last 2 days.  Patient is with home hospice through [facility].  Hospice RN tried to remove fluid from the drain yesterday and today and she was unable to.  RN contacted hospice MD who recommended patient be taken to [facility] ER for further evaluation.  Patient is drowsy but family states this is his baseline.  They deny recent fevers, cough, complaints of chest pain, abdominal pain or changes in his baseline behavior.  Patient denies any abdominal pain and has no other complaints today."  Physical Exam  BP (!) 118/40 (BP Location: Left Arm)   Pulse (!) 56   Temp 98.2 F (36.8 C) (Axillary)   Resp 16   Ht 5' 9"  (1.753 m)   Wt 105.2 kg   SpO2 98%   BMI 34.26 kg/m   Physical Exam  Jaundiced. No abdominal distention.  No tenderness to palpation.  Dermabond is in place over the catheter site that has since been removed.  Clear fluid is draining briskly near the site.  MDM   72 year old male received signout from [place] pending a.m. IR consult.  Please see her note for further history, exam, and medical decision making.  Clinical Course as of Oct 27 1756  Mon Oct 27, 2018  0107 Hemoglobin(!): 8.5 [CG]  0107 Platelets(!): 42 [CG]  0107 Potassium(!): 5.2 [CG]  0107 Creatinine(!): 4.86 [CG]  0107 AST(!): 50 [CG]  0107 GFR, Est Non African American(!): 11 [CG]  1203 Spoke with Ailene Ravel, hospice nurse, regarding the patient's plan.  Discussed that the catheter has been removed and Dermabond was placed over the site to  prevent any leaking.  Bedside ultrasound performed by IR did not show any ascites at this time.  IR team discussed with the patient's wife to contact Dr. Laural Golden for paracentesis order that can be performed at [facility].    [MM]  1228 Patient evaluated in the ED. The patient has a dressing in the RLQ covered with Tegaderm that is saturated with clear fluid. Tegaderm removed and gauze dressing is saturated with fluid. There is a stream of fluid is briskly leaking from the site, soaking his pants and gown.    [MM]  1322 Spoke with the patient's nurse.  The patient was seen by VIR PA who packed the wound and recommended the patient be non-dependent on his right side for the next 30 minutes to an hour.  Discussed with the patient's nurse that I will reevaluate the patient in 45 minutes and if leaking has significantly improved, will plan for discharge.   [MM]  1950 I evaluated the patient after he had been nondependent on his right side for the last 45 minutes.  Dressing was dry with no signs of any drainage.  The patient and his wife felt comfortable with discharge at this time.  Discussed follow-up per IR's note.  All questions answered.  The patient is hemodynamically stable and in no acute distress.  He is safe for discharge home with outpatient follow-up at this time.   [MM]    Clinical  Course User Index [CG] Kinnie Feil, PA-C [MM] , Laymond Purser, PA-C     Procedures       Joanne Gavel, PA-C 10/27/18 1757    Orpah Greek, MD 10/29/18 [phone]

## 2018-10-27 NOTE — Discharge Instructions (Addendum)
Follow up with Dr. Karilyn Cotaehman regarding the paracentesis to remove fluid off the belly at [facility].  Meredith, the hospice nurse, can be reached at [phone].  Have your nurse replace the dressing on the site in the next few days.   Return to the ER for new or worsening symptoms.

## 2018-10-27 NOTE — ED Notes (Signed)
PA informed of CBG, no insulin at this time as pt is being transported to IR

## 2018-10-27 NOTE — Progress Notes (Signed)
Wife states that pt prefers to have paracentesis at [facility]. Advised pt wife to follow up with Dr Karilyn Cotaehman office to have paracentesis ordered and [facility].

## 2018-10-27 NOTE — Progress Notes (Signed)
Patient ID: Larry Hayes, male   DOB: 03/15/1946, 72 y.o.   MRN: [id number]   Cirrhosis with refractory ascites Rt abdominal tunneled peritoneal catheter placed 10/22/18  Has come to ED last night with weeping around catheter x2 days  Request made for evaluation of same  Brought to IR Tech unable to flush catheter Cannot withdraw  Dr Miles CostainShick evaluated catheter and site No sign of infection - but unable to aspirate or flush  Rec: removal of catheter and will need to continue paracentesis as needed  US limited abd was performed at bedside No collection of ascites noted  Removal of tunneled peritoneal catheter was without complication Some leaking from site was controlled with pressure and dressing after placement of Derma Bond  Return to ED  Discussed with wife [facility]acentesis to [facility] For convenience She will contact Dr Karilyn Cotaehman for Rx and order

## 2018-10-27 NOTE — ED Notes (Signed)
Checked patient cbg it was [id number] notified RN of blood sugar patient is resting with family at bedside

## 2018-12-10 DEATH — deceased

## 2018-12-15 ENCOUNTER — Encounter (INDEPENDENT_AMBULATORY_CARE_PROVIDER_SITE_OTHER): Payer: Self-pay

## 2018-12-19 ENCOUNTER — Ambulatory Visit (INDEPENDENT_AMBULATORY_CARE_PROVIDER_SITE_OTHER): Payer: Medicare Other | Admitting: Internal Medicine

## 2019-02-14 IMAGING — US US ABDOMEN LIMITED
1 series · 4 of 4 positions shown · non-contrast
Comparison: 10/02/2017

CLINICAL DATA: Cirrhosis, ascites

EXAM:
LIMITED ABDOMEN ULTRASOUND FOR ASCITES
TECHNIQUE: Limited ultrasound survey for ascites was performed in all four
abdominal quadrants.

[Series 1: us abdomen limited · 0.25mm/px · 4 of 4 slices shown]
[im 1/4]
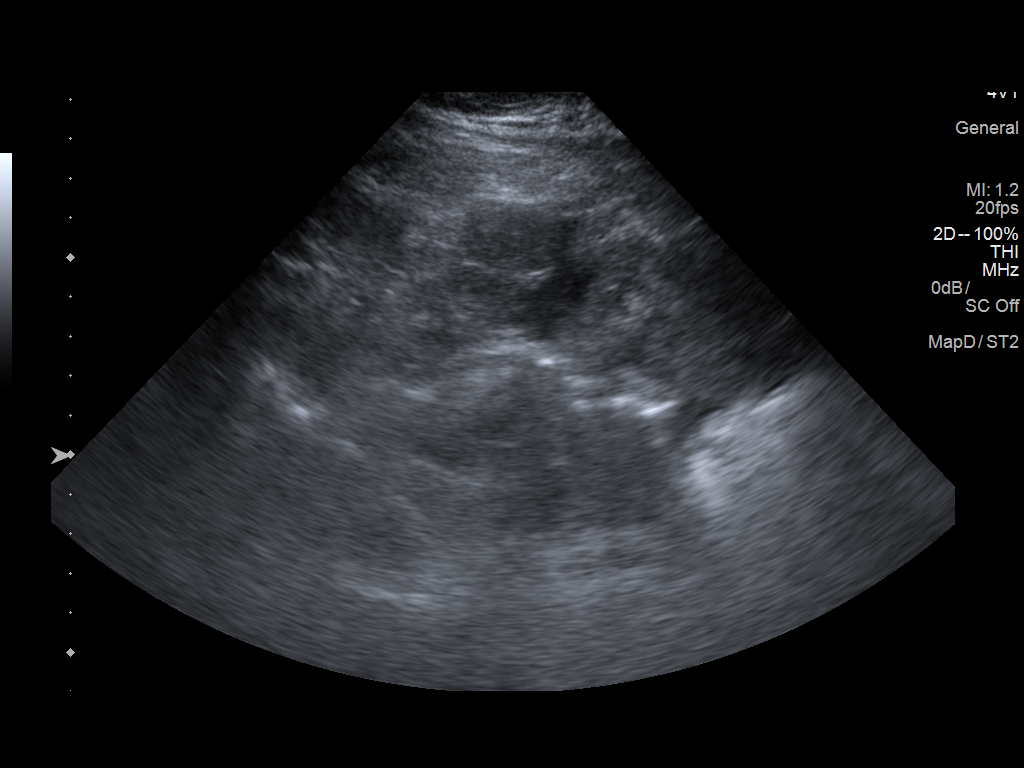
[im 2/4]
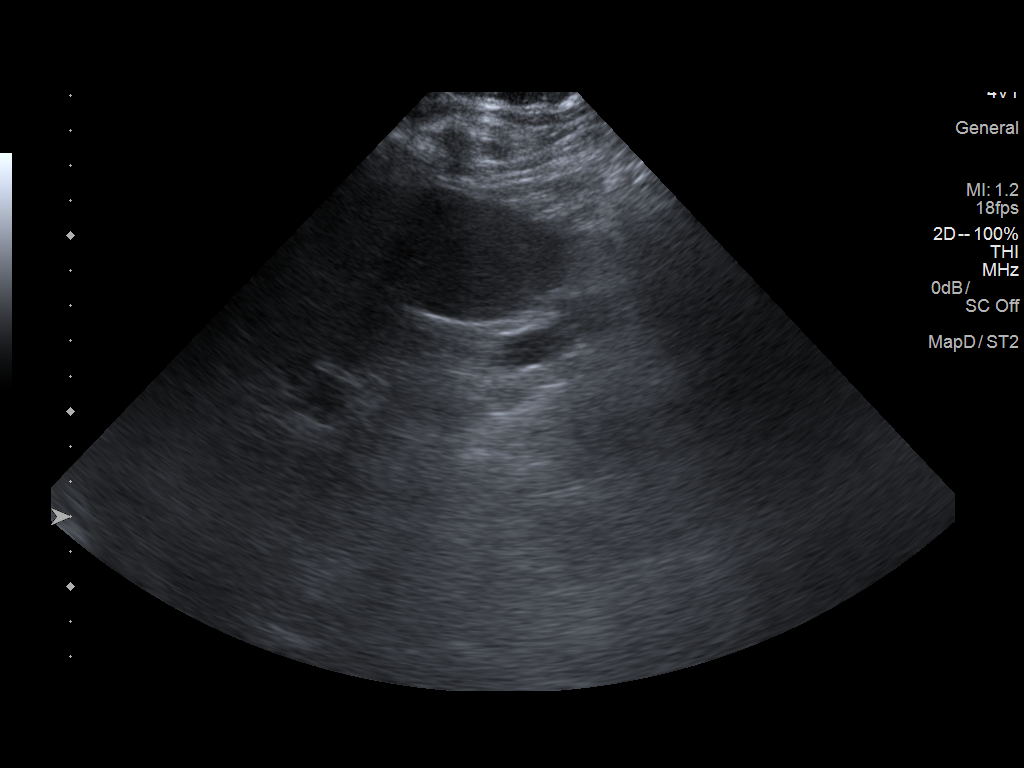
[im 3/4]
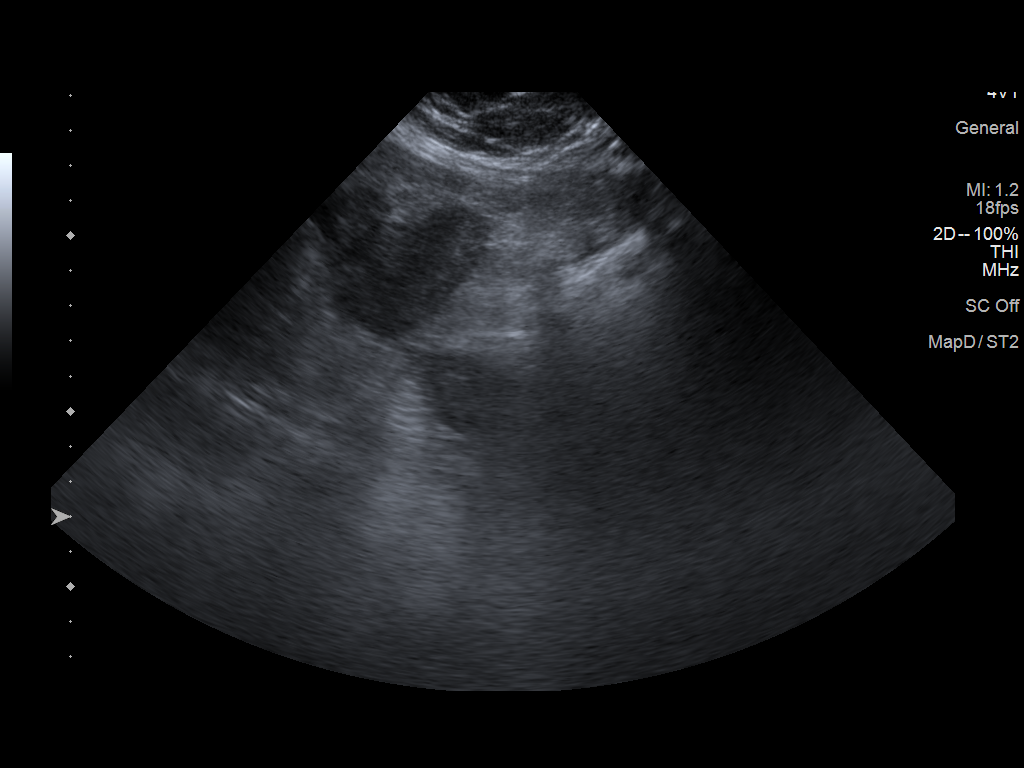
[im 4/4]
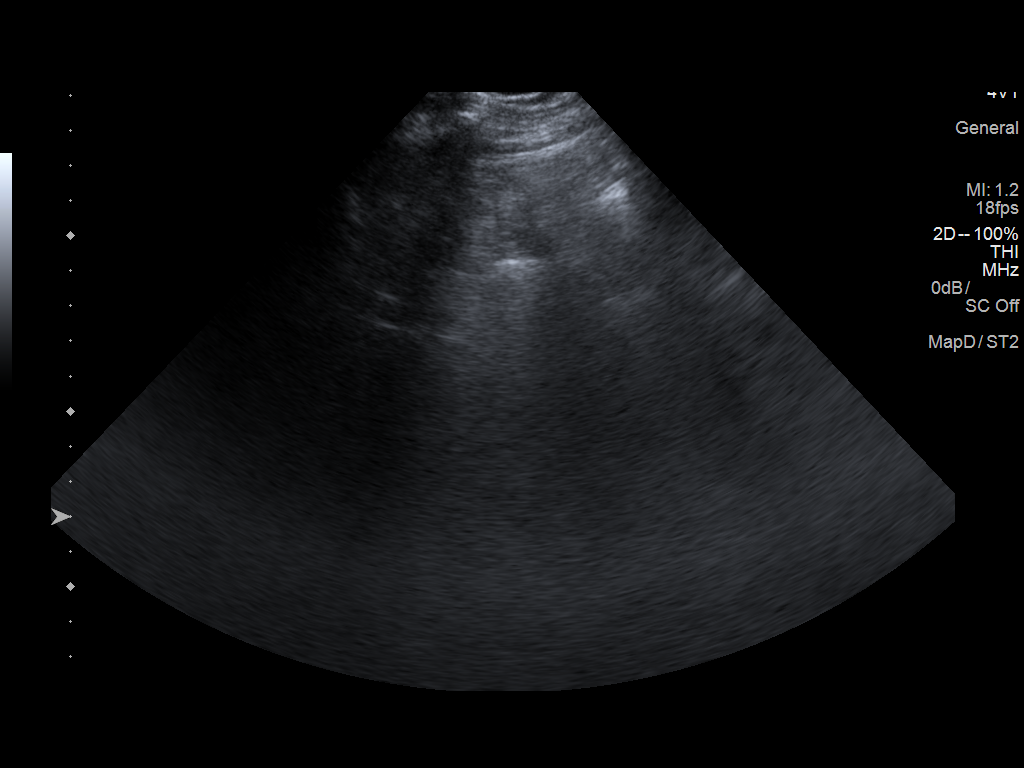

[4 of 4 positions shown; findings below may reference images not displayed]

FINDINGS: Sonography of the 4 quadrants was performed in anticipation of
planned paracentesis.

No ascites is identified.
IMPRESSION: No ascites.

## 2019-04-17 IMAGING — US US ABDOMEN COMPLETE
1 series · 13 of 25 positions shown · non-contrast
Comparison: Ultrasound 01/30/2018, 12/20/2017, 08/02/2016.

CLINICAL DATA: Cirrhosis.

EXAM:
ABDOMEN ULTRASOUND COMPLETE

[Series 1: us abdomen complete · 0.19mm/px · 13 of 105 slices shown]
[im 1/105]
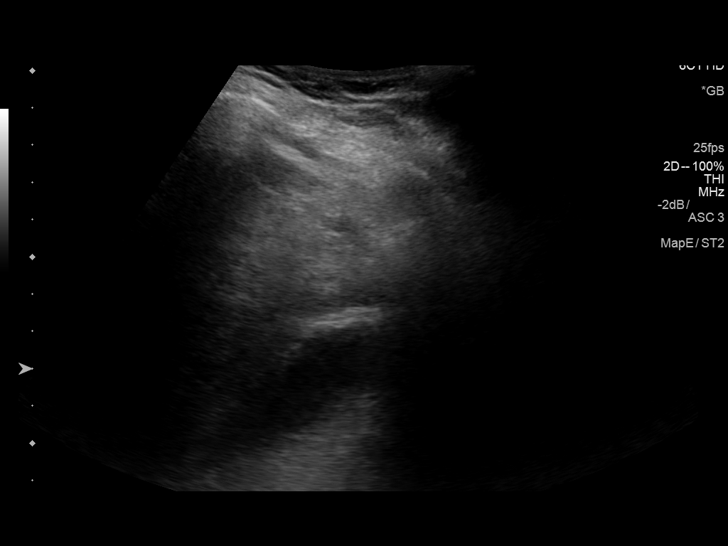
[im 9/105]
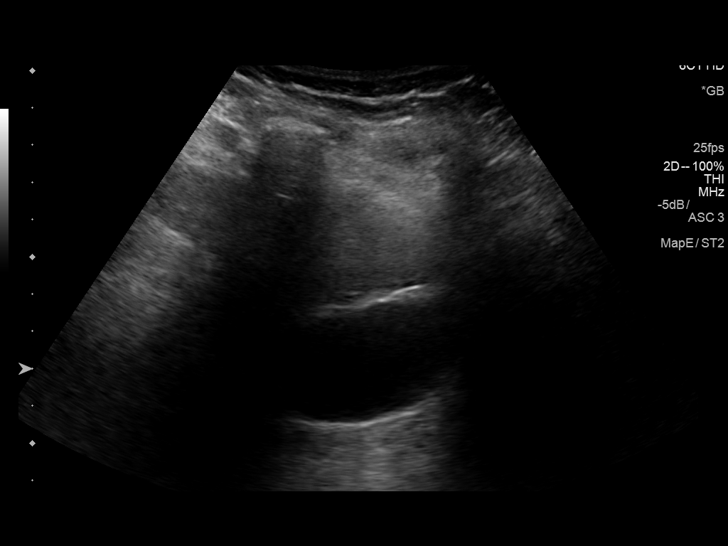
[im 18/105]
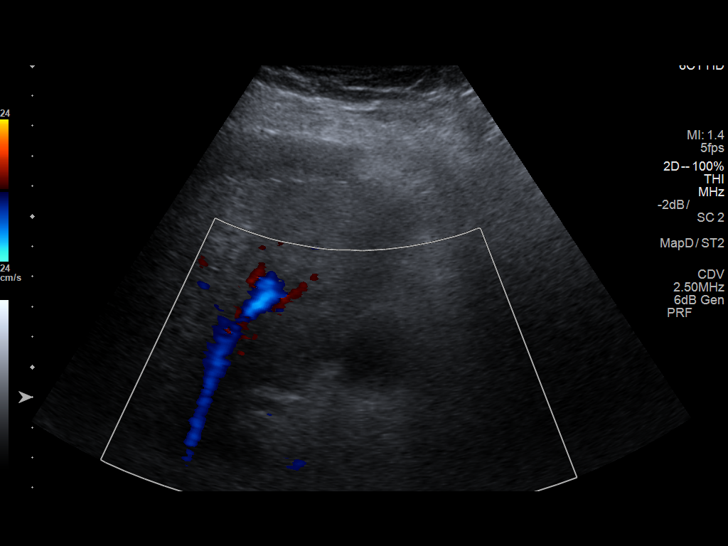
[im 27/105]
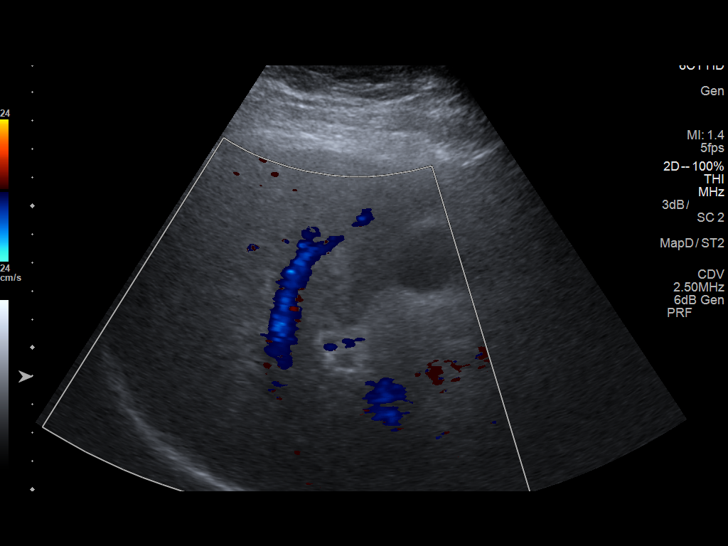
[im 35/105]
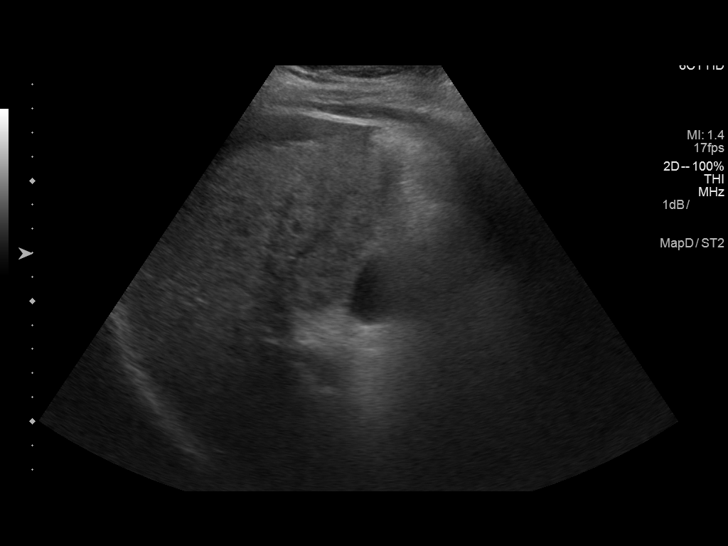
[im 44/105]
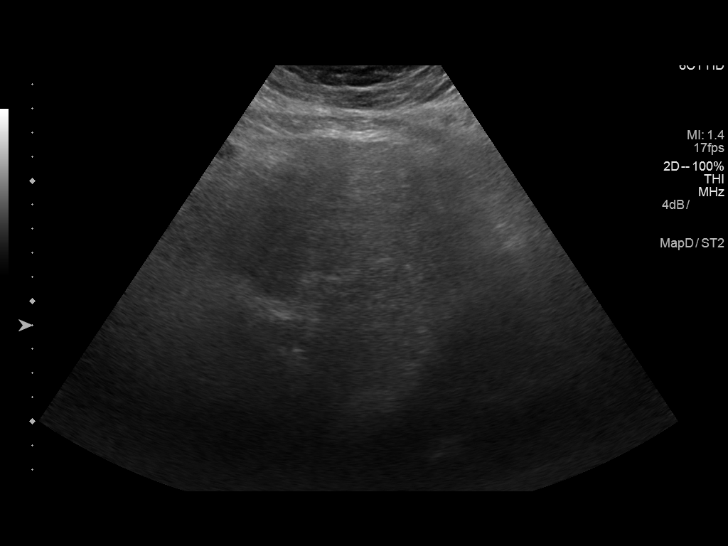
[im 53/105]
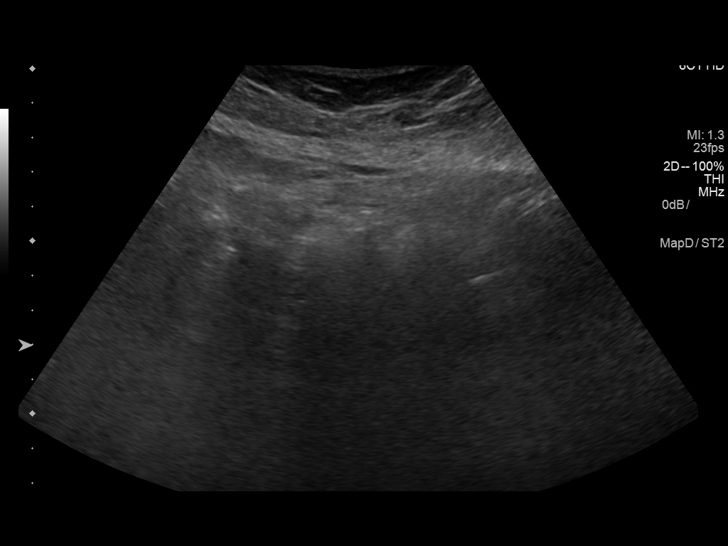
[im 61/105]
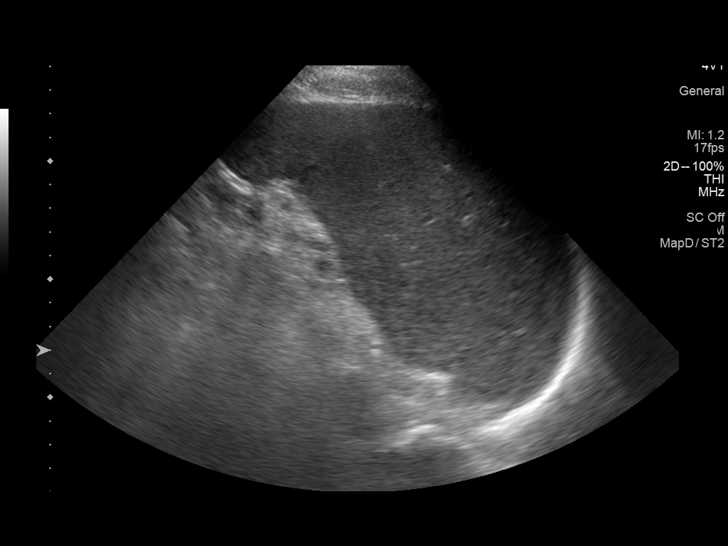
[im 70/105]
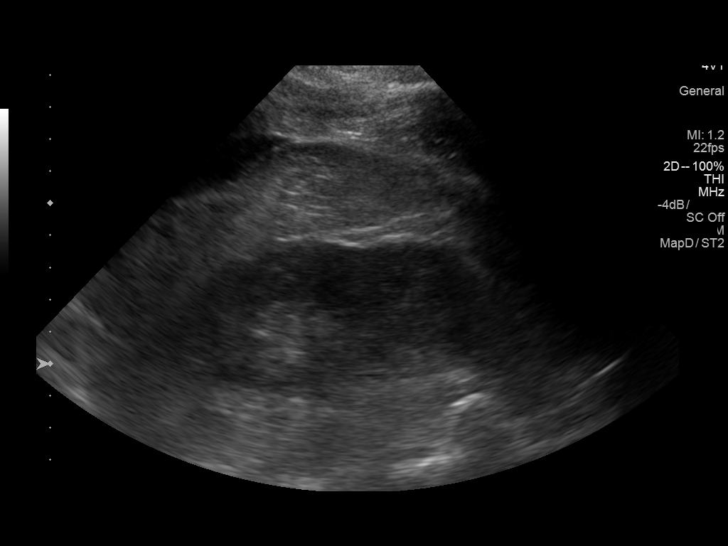
[im 79/105]
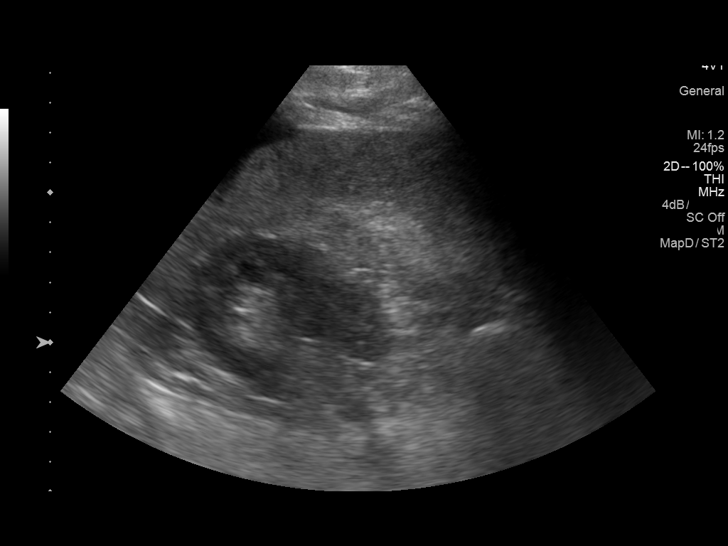
[im 87/105]
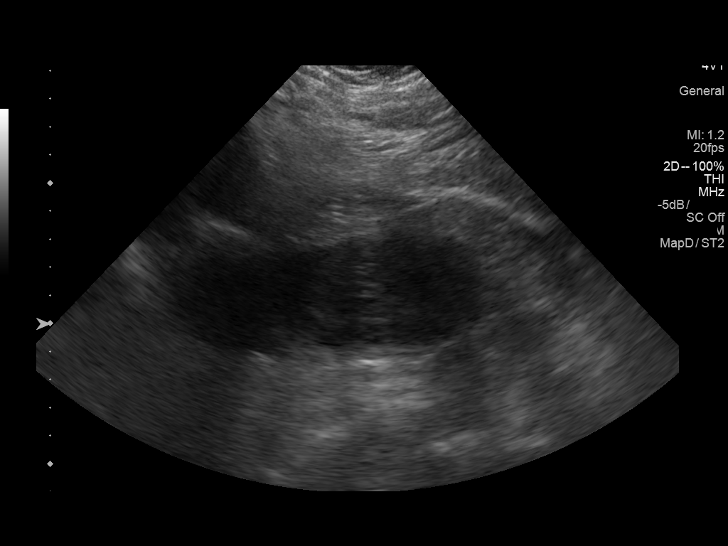
[im 96/105]
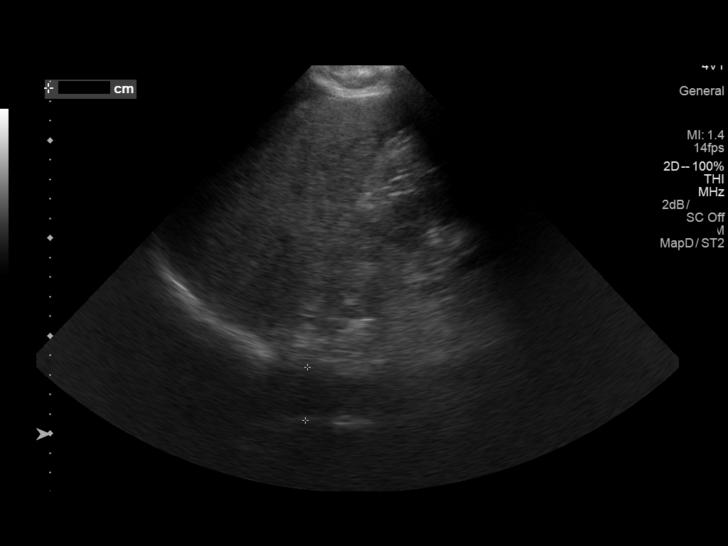
[im 105/105]
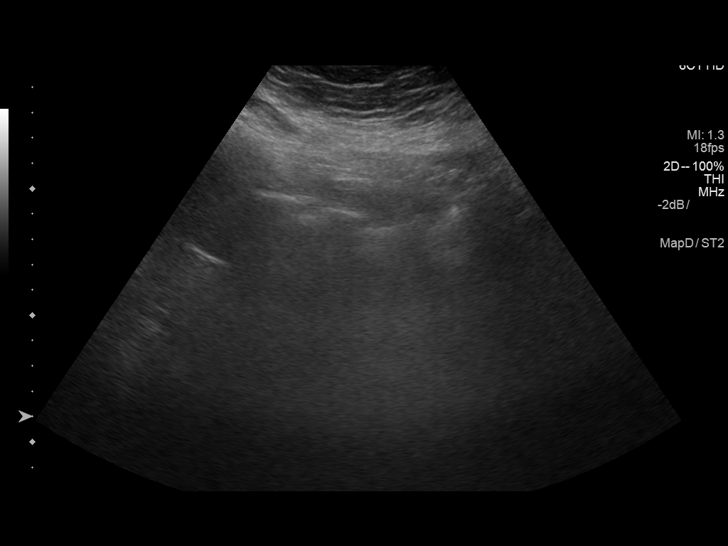

[13 of 25 positions shown; findings below may reference images not displayed]

FINDINGS: Gallbladder: No gallstones. No gallbladder polyp noted. Minimal
prominence of the gallbladder wall 3.5 mm. This may be from
hypoproteinemia given mild ascites noted. Negative Murphy sign. No
pericholecystic fluid.

Common bile duct: Diameter: 5.4 mm

Liver: No focal mass lesion. Increased echogenicity consistent fatty
infiltration and/or hepatocellular disease. Irregular liver border
suggesting cirrhosis. Portal vein is patent on color Doppler imaging
with normal direction of blood flow towards the liver.

IVC: No abnormality visualized.

Pancreas: Visualized portion unremarkable.

Spleen: Splenomegaly spleen measures 15.3 cm with a volume of 9281
cc.

Right Kidney: Length: 12.4 cm. Echogenicity within normal limits. No
mass or hydronephrosis visualized.

Left Kidney: Length: 13.8 cm. Echogenicity within normal limits. No
mass or hydronephrosis visualized.

Abdominal aorta: No aneurysm visualized.

Other findings: Very mild ascites.
IMPRESSION: 1. Increased hepatic echogenicity consistent fatty infiltration
and/or hepatocellular disease. Irregular liver border suggesting
cirrhosis. Splenomegaly. Mild ascites.

2. No gallstones. No gallbladder polyp. Minimal prominence of the
gallbladder wall may be secondary to hypoproteinemia given ascites
present. Negative Murphy sign. No biliary distention.

## 2019-10-23 IMAGING — US US ABDOMEN COMPLETE
1 series · 14 of 25 positions shown · non-contrast
Comparison: 02/20/2018.

CLINICAL DATA: Cirrhosis with ascites.

EXAM:
ABDOMEN ULTRASOUND COMPLETE

[Series 1: us abdomen complete · 0.24mm/px · 14 of 87 slices shown]
[im 1/87]
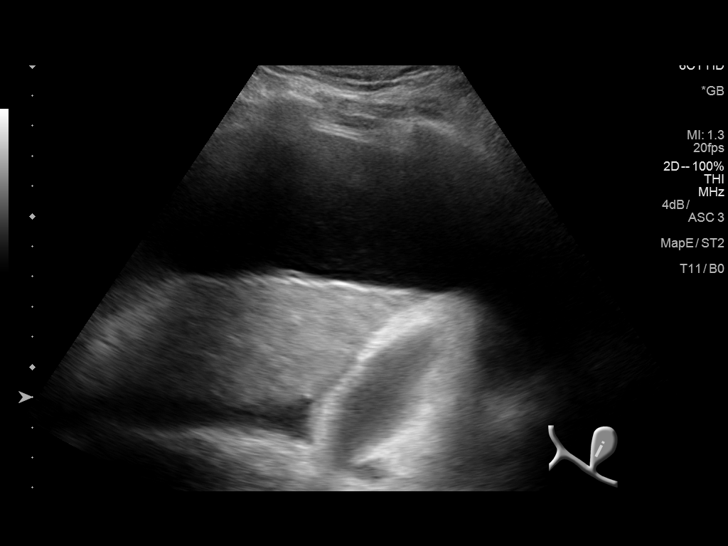
[im 8/87]
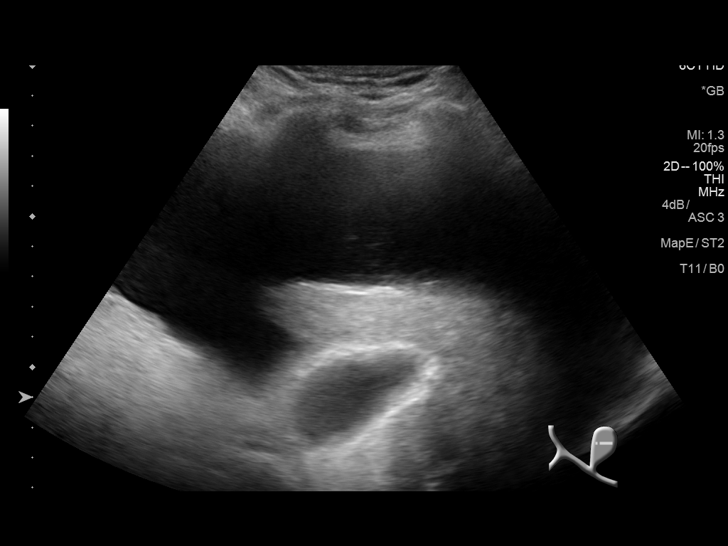
[im 15/87]
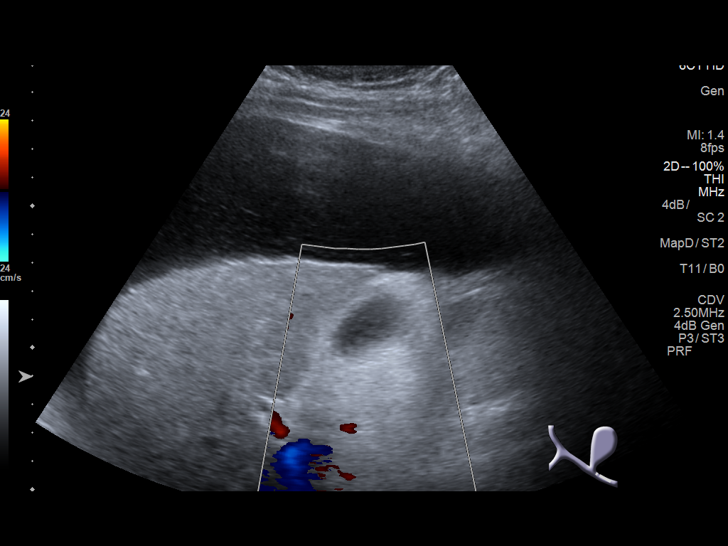
[im 22/87]
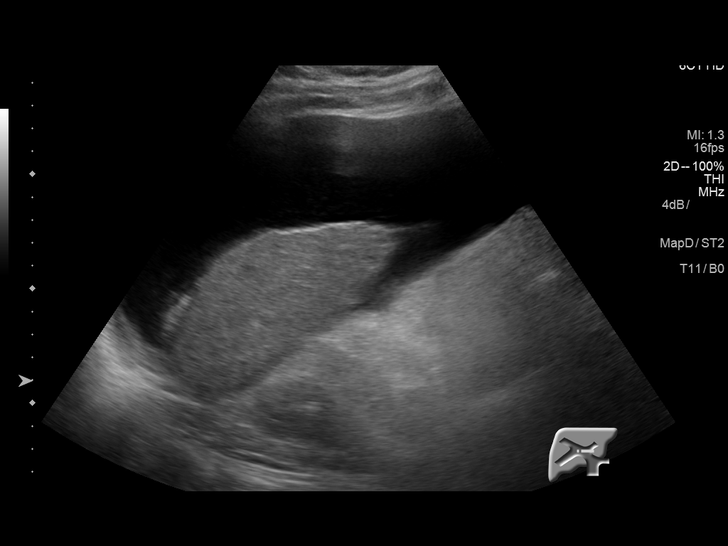
[im 29/87]
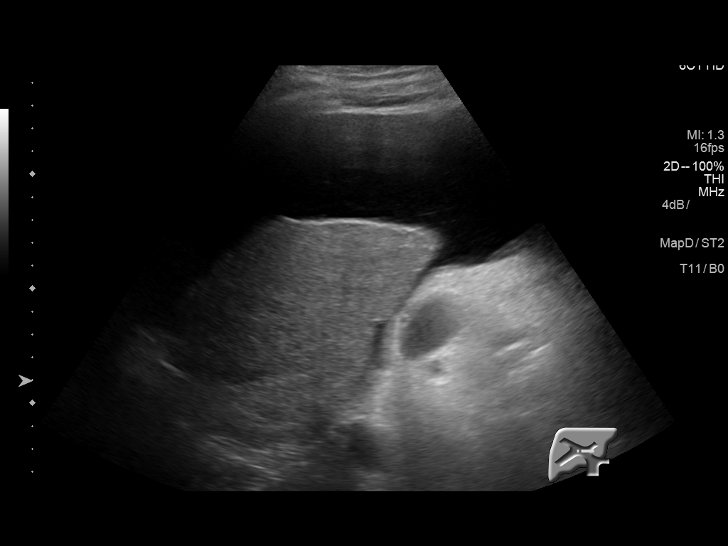
[im 33/87]
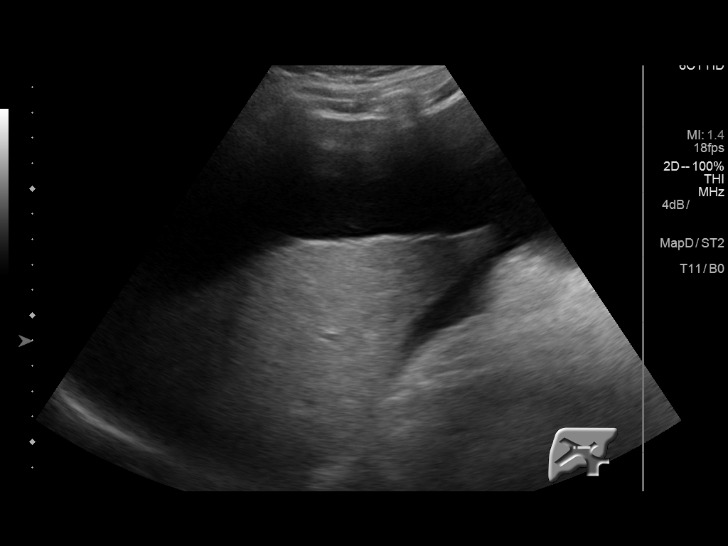
[im 40/87]
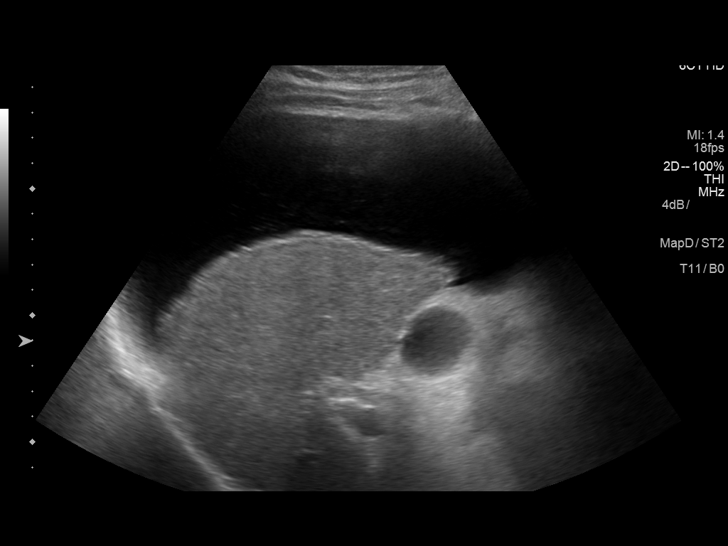
[im 47/87]
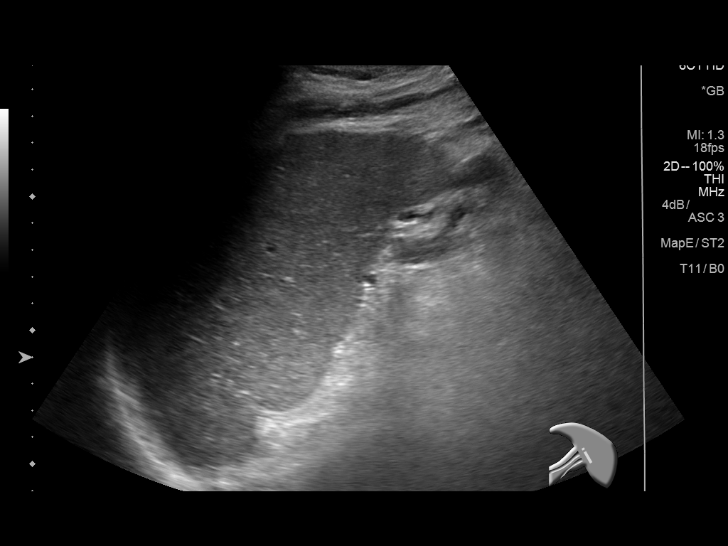
[im 54/87]
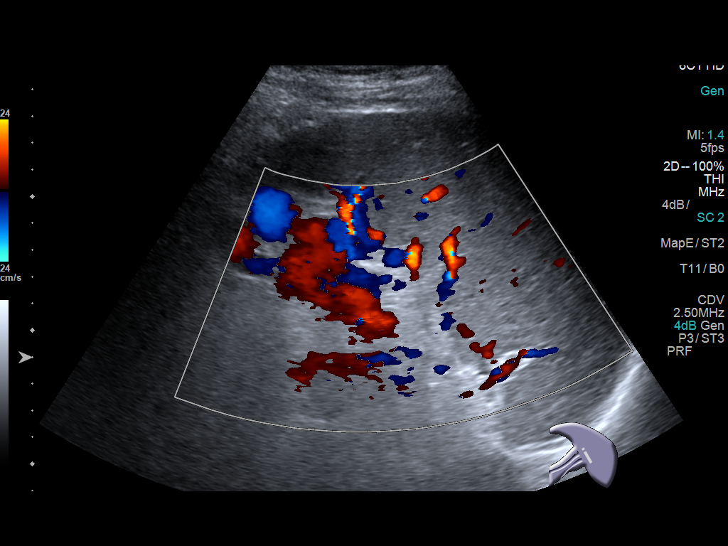
[im 58/87]
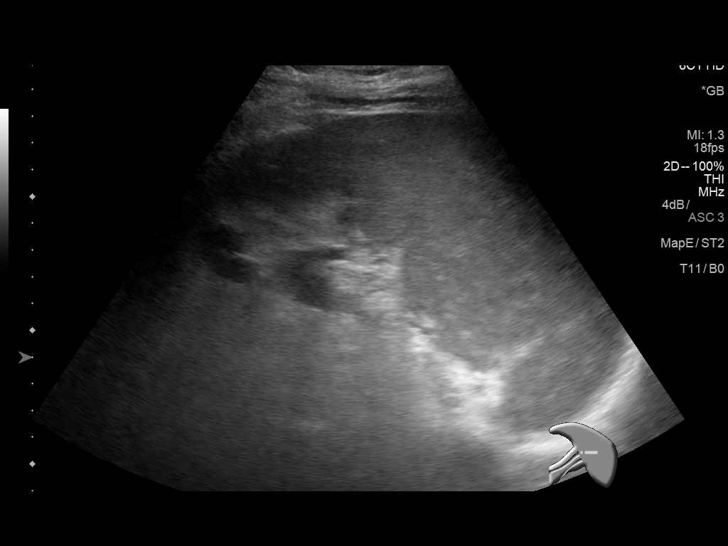
[im 65/87]
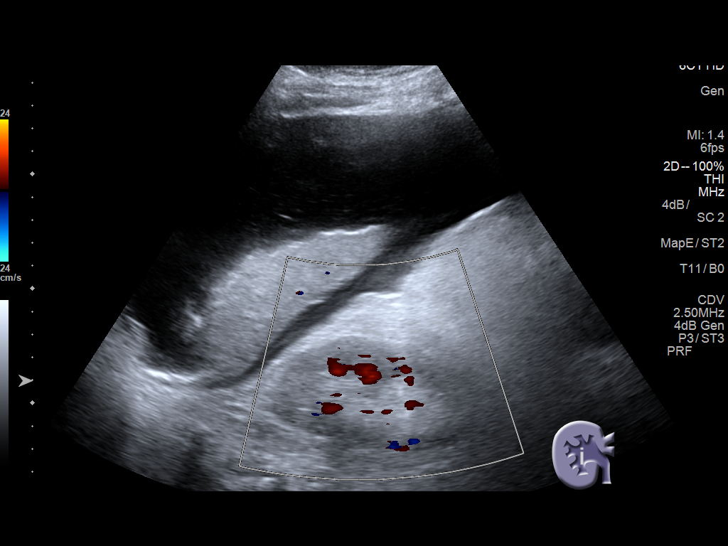
[im 72/87]
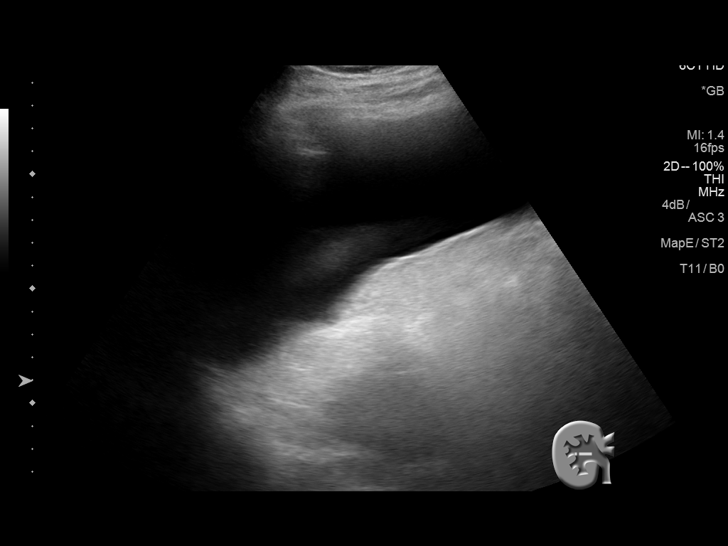
[im 79/87]
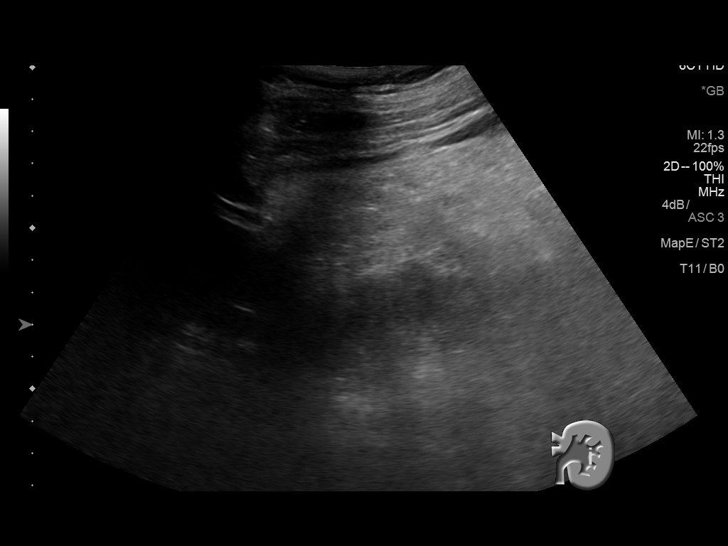
[im 87/87]
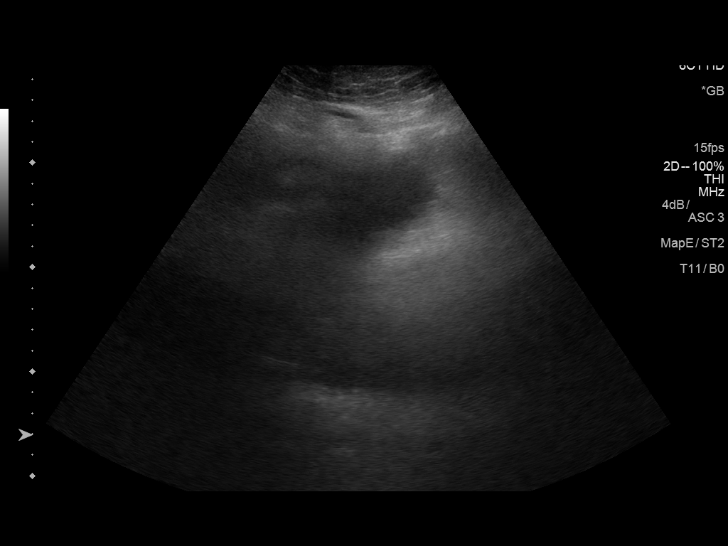

[14 of 25 positions shown; findings below may reference images not displayed]

FINDINGS: Gallbladder: No gallstones. Mild gallbladder wall thickening up to 5
mm, similar to priors. No sonographic Murphy sign noted by
sonographer. Gallbladder sludge without polyp.

Common bile duct: Diameter: 3.9 mm

Liver: Nodular contour and heterogeneous but overall increased
echotexture consistent with cirrhosis. No focal mass. Portal vein is
patent on color Doppler imaging with normal direction of blood flow
towards the liver.

IVC: No abnormality visualized.

Pancreas: Not visible due to increased gas.

Spleen: Splenomegaly, 13.8 cm length, volume 785 cubic cm.
Perisplenic varices.

Right Kidney: Length: 10.0. Echogenicity within normal limits. No
mass or hydronephrosis visualized.

Left Kidney: Length: 11.0. Echogenicity within normal limits. No
mass or hydronephrosis visualized.

Abdominal aorta: Not visible due to gas.

Other findings: Marked ascites.
IMPRESSION: Cirrhosis with ascites. No mass or acute finding. Gallbladder sludge
and wall thickening, similar to priors.

## 2020-07-20 IMAGING — US US PARACENTESIS
1 series · 4 of 4 positions shown · non-contrast
Comparison: Previous paracentesis.

MEDICATIONS:
10 cc 1% lidocaine

COMPLICATIONS:
None immediate

INDICATION: Recurrent ascites

EXAM:
ULTRASOUND-GUIDED PARACENTESIS
TECHNIQUE: Informed written consent was obtained from the patient after a
discussion of the risks, benefits and alternatives to treatment. A
timeout was performed prior to the initiation of the procedure.

[Series 1: us paracentesis · 4 of 4 slices shown]
[im 1/4]
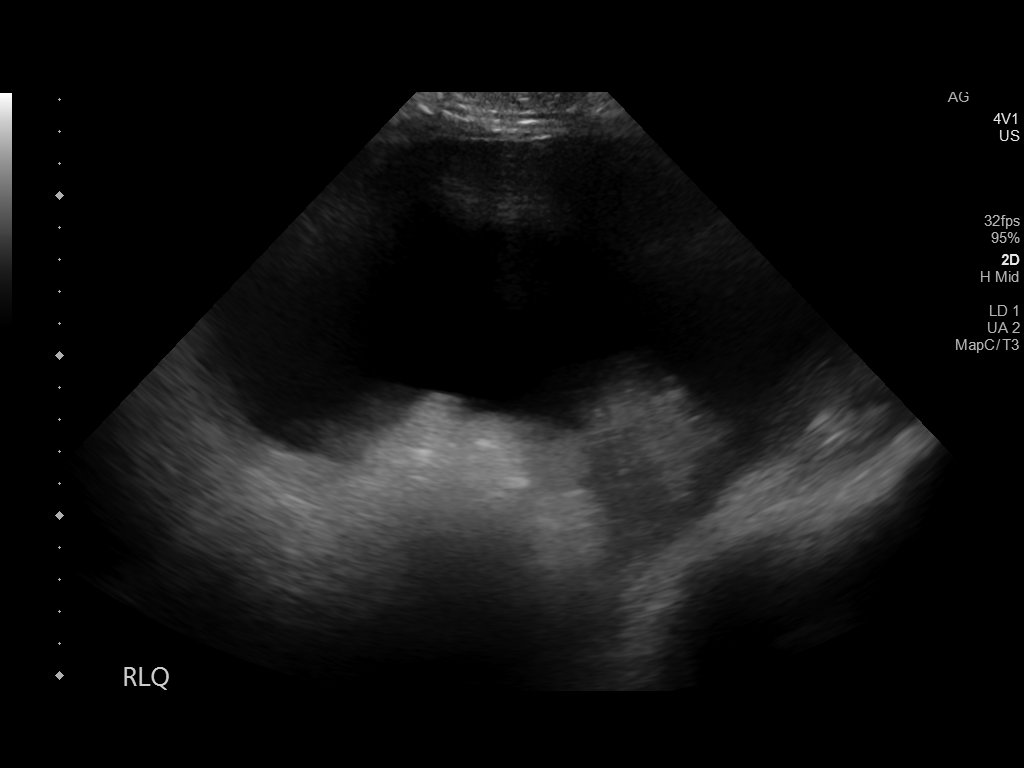
[im 2/4]
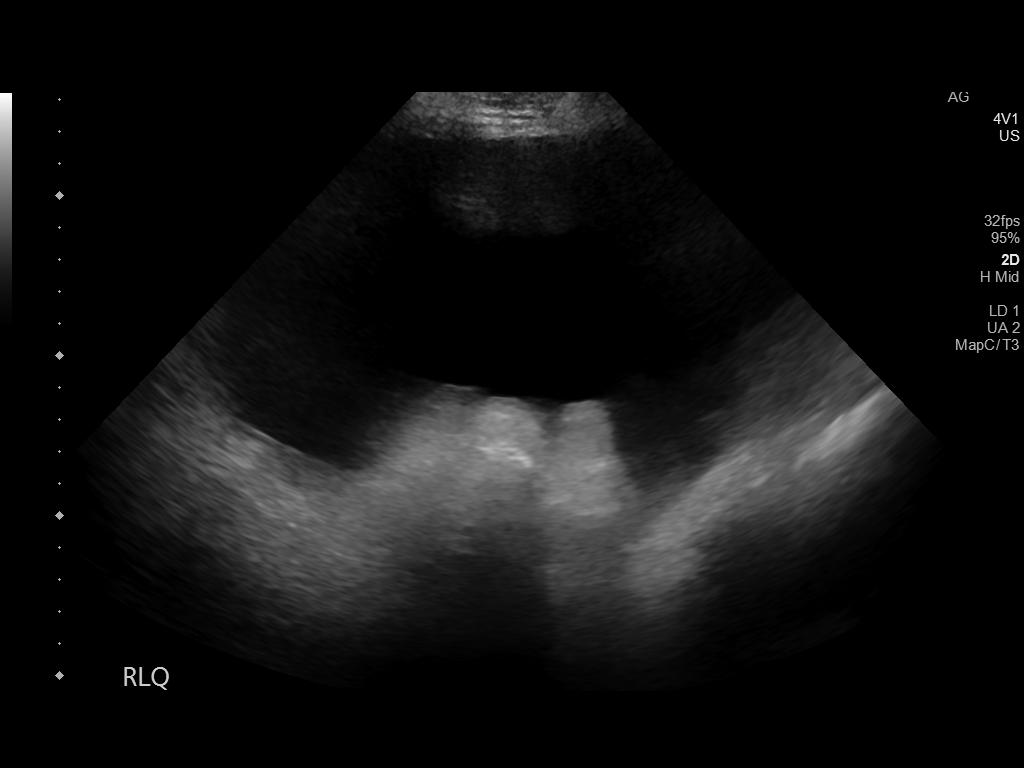
[im 3/4]
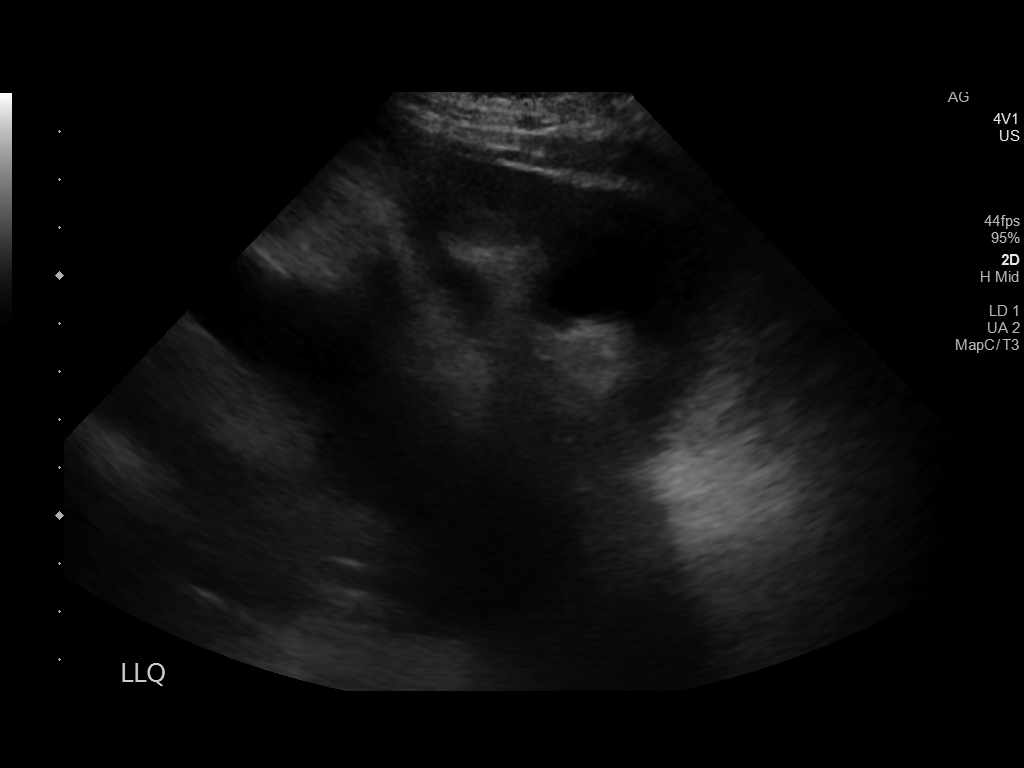
[im 4/4]
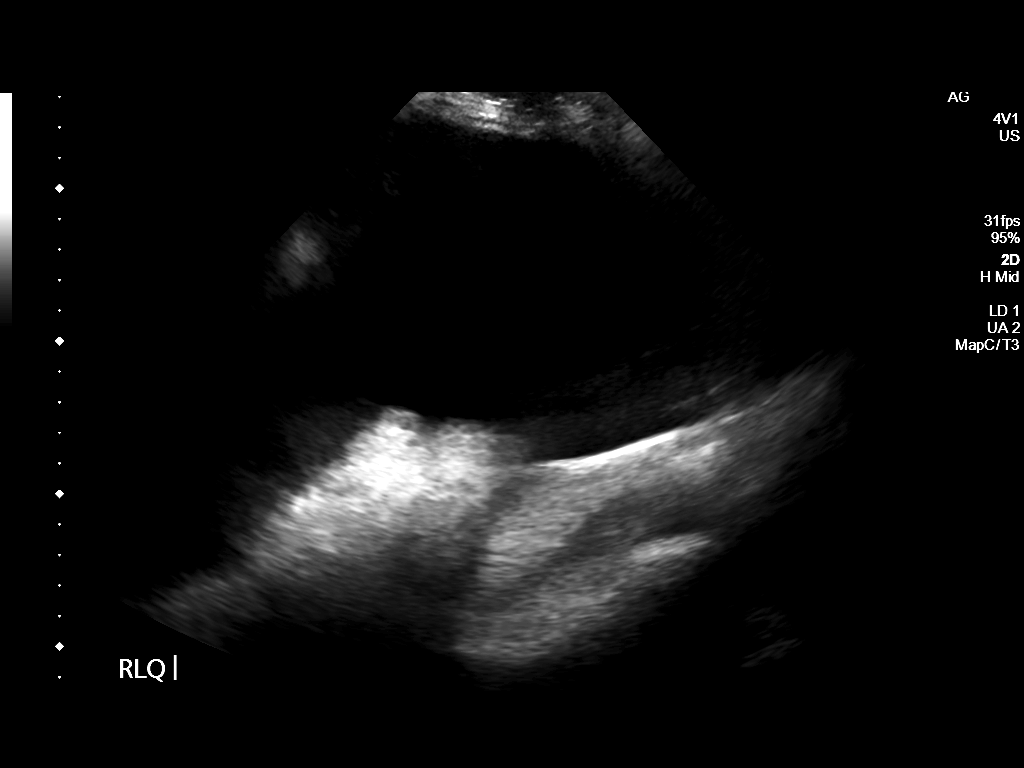

[4 of 4 positions shown; findings below may reference images not displayed]

Initial ultrasound scanning demonstrates a large amount of ascites
within the right lower abdominal quadrant. The right lower abdomen
was prepped and draped in the usual sterile fashion. 1% lidocaine
with epinephrine was used for local anesthesia. Under direct
ultrasound guidance, a 19 gauge, 7-cm, Yueh catheter was introduced.
An ultrasound image was saved for documentation purposed. The
paracentesis was performed. The catheter was removed and a dressing
was applied. The patient tolerated the procedure well without
immediate post procedural complication.
FINDINGS: A total of approximately 9.3 liters of straw colored fluid fluid was
removed. Samples were sent to the laboratory as requested by the
clinical team.
IMPRESSION: Successful ultrasound-guided paracentesis yielding 9.3 liters of
peritoneal fluid.

Read by

Soren Gatica

## 2020-07-28 IMAGING — US US PARACENTESIS
1 series · 2 of 2 positions shown · non-contrast
Comparison: none

INDICATION: Cirrhosis, ascites

[Series 1: us paracentesis · 2 of 2 slices shown]
[im 1/2]
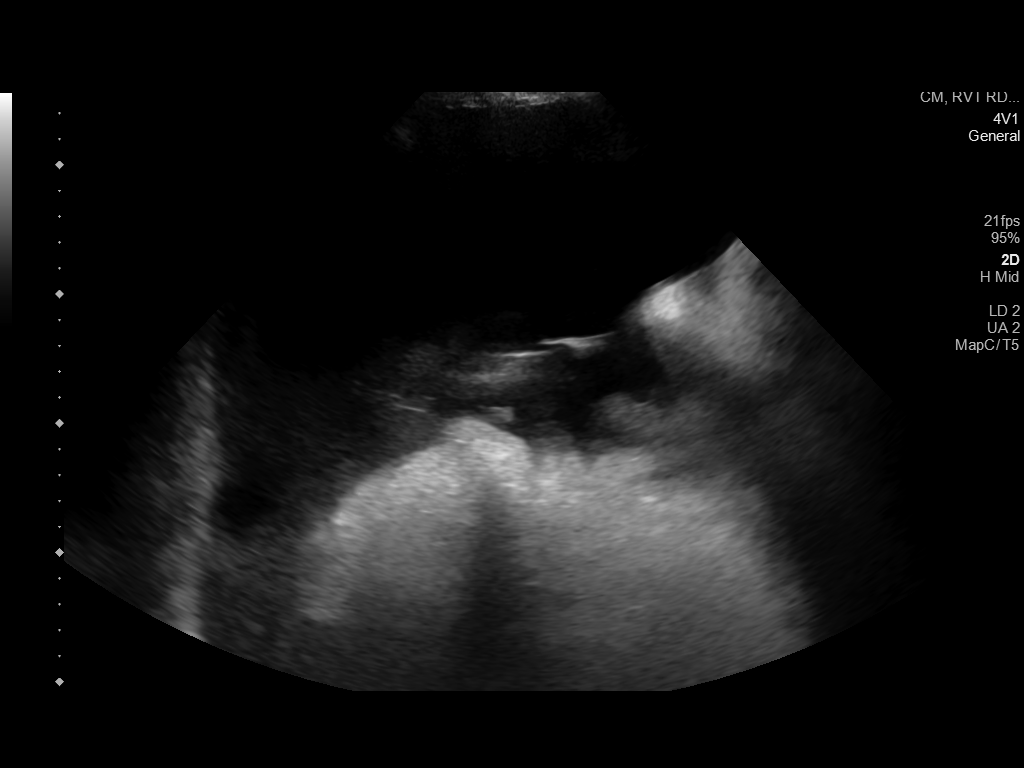
[im 2/2]
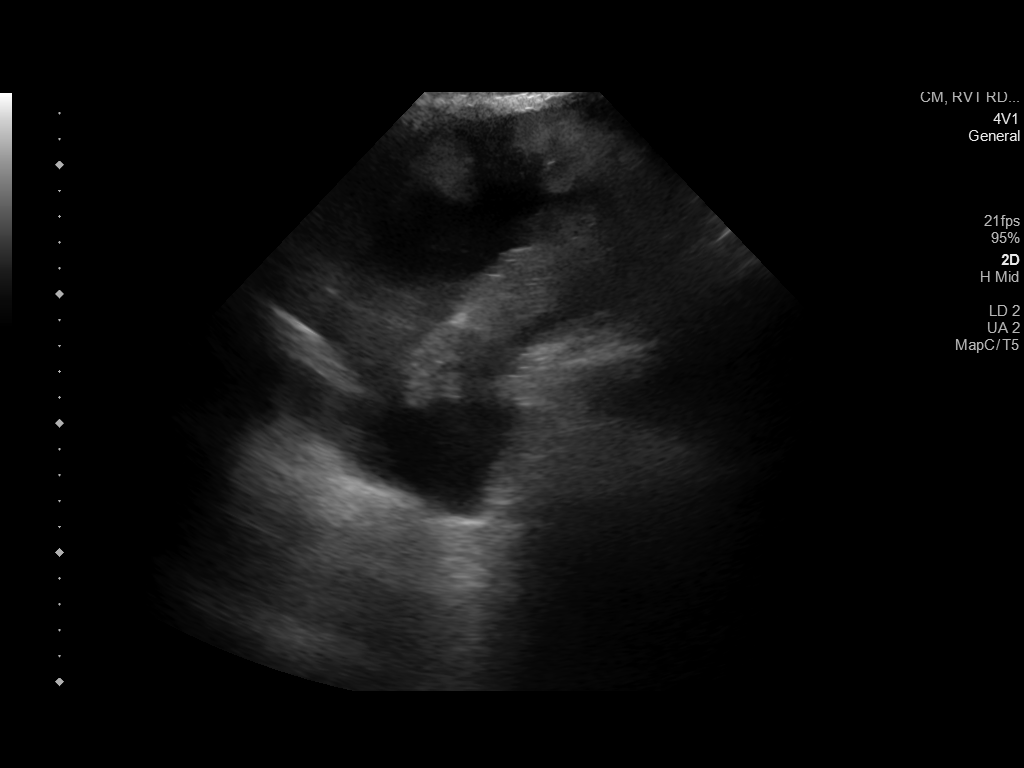

[2 of 2 positions shown; findings below may reference images not displayed]

EXAM:
ULTRASOUND GUIDED DIAGNOSTIC AND THERAPEUTIC PARACENTESIS

MEDICATIONS:
None.

COMPLICATIONS:
None immediate.

PROCEDURE:
Procedure, benefits, and risks of procedure were discussed with
patient.

Written informed consent for procedure was obtained.

Time out protocol followed.

Adequate collection of ascites localized by ultrasound in RIGHT
lower quadrant.

Skin prepped and draped in usual sterile fashion.

Skin and soft tissues anesthetized with 10 mL of 1% lidocaine.

5 French Yueh catheter placed into peritoneal cavity.

7.6 L of yellow ascitic fluid aspirated by vacuum bottle suction.

Procedure tolerated well by patient without immediate complication.
FINDINGS: A total of approximately 7.6 L of ascitic fluid was removed.

Samples were sent to the laboratory as requested by the clinical
team.
IMPRESSION: Successful ultrasound-guided paracentesis yielding 7.6 liters of
peritoneal fluid.
